# Patient Record
Sex: Male | Born: 1960 | Race: Black or African American | Hispanic: No | Marital: Single | Smoking: Current every day smoker
Health system: Southern US, Community
[De-identification: ages and names within clinical notes are randomized; demographics above are authoritative.]

## PROBLEM LIST (undated history)

## (undated) ENCOUNTER — Emergency Department (HOSPITAL_BASED_OUTPATIENT_CLINIC_OR_DEPARTMENT_OTHER): Admission: EM | Source: Ambulatory Visit

## (undated) DIAGNOSIS — E785 Hyperlipidemia, unspecified: Secondary | ICD-10-CM

## (undated) DIAGNOSIS — Z923 Personal history of irradiation: Secondary | ICD-10-CM

## (undated) DIAGNOSIS — I10 Essential (primary) hypertension: Secondary | ICD-10-CM

## (undated) DIAGNOSIS — K219 Gastro-esophageal reflux disease without esophagitis: Secondary | ICD-10-CM

## (undated) DIAGNOSIS — K254 Chronic or unspecified gastric ulcer with hemorrhage: Secondary | ICD-10-CM

## (undated) DIAGNOSIS — Z5189 Encounter for other specified aftercare: Secondary | ICD-10-CM

---

## 1987-06-19 HISTORY — PX: WRIST SURGERY: SHX841

## 1999-06-19 DIAGNOSIS — Z5189 Encounter for other specified aftercare: Secondary | ICD-10-CM

## 1999-06-19 HISTORY — DX: Encounter for other specified aftercare: Z51.89

## 2016-12-20 ENCOUNTER — Encounter (HOSPITAL_COMMUNITY): Payer: Self-pay | Admitting: Emergency Medicine

## 2016-12-20 ENCOUNTER — Emergency Department (HOSPITAL_COMMUNITY)
Admission: EM | Admit: 2016-12-20 | Discharge: 2016-12-20 | Disposition: A | Discharge status: Home / Self Care | Payer: Self-pay | Attending: Emergency Medicine | Admitting: Emergency Medicine

## 2016-12-20 DIAGNOSIS — L02212 Cutaneous abscess of back [any part, except buttock]: Secondary | ICD-10-CM | POA: Insufficient documentation

## 2016-12-20 DIAGNOSIS — F17200 Nicotine dependence, unspecified, uncomplicated: Secondary | ICD-10-CM | POA: Insufficient documentation

## 2016-12-20 DIAGNOSIS — L0291 Cutaneous abscess, unspecified: Secondary | ICD-10-CM

## 2016-12-20 MED ORDER — LIDOCAINE-EPINEPHRINE (PF) 2 %-1:200000 IJ SOLN
20.0000 mL | Freq: Once | INTRAMUSCULAR | Status: AC
  Administered 2016-12-20: 20 mL via INTRADERMAL
  Filled 2016-12-20: qty 20

## 2016-12-20 MED ORDER — OXYCODONE-ACETAMINOPHEN 5-325 MG PO TABS
1.0000 | ORAL_TABLET | Freq: Once | ORAL | Status: AC
  Administered 2016-12-20: 1 via ORAL
  Filled 2016-12-20: qty 1

## 2016-12-20 NOTE — ED Notes (Signed)
Room set up for I&D.

## 2016-12-20 NOTE — ED Notes (Signed)
Red, raised area on mid back, onset 1 week ago.

## 2016-12-20 NOTE — ED Provider Notes (Signed)
[facility]-EMERGENCY DEPT Provider Note   CSN: [id number] Arrival date & time: 12/20/16  [id number]  By signing my name below, I, Rosana Fretana Waskiewicz, attest that this documentation has been prepared under the direction and in the presence of non-physician practitioner, Lashea Goda A., PA-C. Electronically Signed: Rosana Fretana Waskiewicz, ED Scribe. 12/20/16. 9:56 AM.  History   Chief Complaint Chief Complaint  Patient presents with  . Abscess   The history is provided by the patient. No language interpreter was used.   HPI Comments: Christian Howard is a 56 y.o. male who presents to the Emergency Department complaining of a moderate, gradually worsening area of pain and swelling to the lower back onset 10 days ago. Pt has a hx of similar symptoms. Pt states pain is exacerbated with palpation and direct pressure. No hx of chronic back pain or IV drug use. Denies fever, chills, drainage from the area. NKDA  History reviewed. No pertinent past medical history.  There are no active problems to display for this patient.   History reviewed. No pertinent surgical history.   Home Medications    Prior to Admission medications   Not on File    Family History No family history on file.  Social History Social History  Substance Use Topics  . Smoking status: Current Every Day Smoker  . Smokeless tobacco: Current User  . Alcohol use No     Allergies   Patient has no allergy information on record.   Review of Systems Review of Systems  Constitutional: Negative for activity change, chills and fever.  Respiratory: Negative for shortness of breath.   Cardiovascular: Negative for chest pain.  Gastrointestinal: Negative for abdominal pain.  Musculoskeletal: Negative for back pain.  Skin: Positive for wound. Negative for rash.     Physical Exam Updated Vital Signs BP 131/70 (BP Location: Left Arm)   Pulse 95   Temp (!) 97.3 F (36.3 C) (Oral)   Resp 16   Ht 5\' 8"  (1.727 m)   Wt 102.1 kg (225 lb)    SpO2 98%   BMI 34.21 kg/m   Physical Exam  Constitutional: He appears well-developed.  HENT:  Head: Normocephalic.  Eyes: Conjunctivae are normal.  Neck: Neck supple.  Cardiovascular: Normal rate, regular rhythm and normal heart sounds.  Exam reveals no gallop and no friction rub.   No murmur heard. Pulmonary/Chest: Effort normal. No respiratory distress. He has wheezes (scattered expiratory on the right). He has no rales.  Abdominal: Soft. He exhibits no distension.  Neurological: He is alert.  Skin: Skin is warm and dry.  3 by 3 cm warm and indurated abscess on the right lower back. No active drainage. No surrounding warmth, erythema, or swelling.  Psychiatric: His behavior is normal.  Nursing note and vitals reviewed.  ED Treatments / Results  DIAGNOSTIC STUDIES: Oxygen Saturation is 98% on RA, normal by my interpretation.   COORDINATION OF CARE: 9:52 AM-Discussed next steps with pt including an US and possible course of antibiotics. Pt verbalized understanding and is agreeable with the plan.   Labs (all labs ordered are listed, but only abnormal results are displayed) Labs Reviewed - No data to display  EKG  EKG Interpretation None       Radiology No results found.  Procedures .Marland Kitchen.Incision and Drainage Date/Time: 12/20/2016 10:40 AM Performed by: Lilian KapurMCDONALD, Jezebelle Ledwell A Authorized by: Frederik PearMCDONALD, Rmani Kellogg A   Consent:    Consent obtained:  Verbal   Consent given by:  Patient   Risks discussed:  Bleeding,  incomplete drainage and pain Location:    Type:  Abscess   Size:  3x3 cm   Location:  Trunk   Trunk location: right lower back. Pre-procedure details:    Skin preparation:  Betadine Anesthesia (see MAR for exact dosages):    Anesthesia method:  Local infiltration   Local anesthetic:  Lidocaine 2% WITH epi Procedure type:    Complexity:  Simple Procedure details:    Needle aspiration: no     Incision types:  Single straight   Incision depth:  Dermal   Scalpel  blade:  11   Wound management:  Probed and deloculated   Drainage:  Bloody and purulent   Drainage amount:  Moderate   Wound treatment:  Wound left open   Packing materials:  None Post-procedure details:    Patient tolerance of procedure:  Tolerated well, no immediate complications    (including critical care time)  Medications Ordered in ED Medications  lidocaine-EPINEPHrine (XYLOCAINE W/EPI) 2 %-1:200000 (PF) injection 20 mL (20 mLs Intradermal Given 12/20/16 0930)  oxyCODONE-acetaminophen (PERCOCET/ROXICET) 5-325 MG per tablet 1 tablet (1 tablet Oral Given 12/20/16 1041)     Initial Impression / Assessment and Plan / ED Course  I have reviewed the triage vital signs and the nursing notes.  Pertinent labs & imaging results that were available during my care of the patient were reviewed by me and considered in my medical decision making (see chart for details).  EMERGENCY DEPARTMENT US SOFT TISSUE INTERPRETATION "Study: Limited Soft Tissue Ultrasound"  INDICATIONS: Soft tissue infection Multiple views of the body part were obtained in real-time with a multi-frequency linear probe  PERFORMED BY: Myself IMAGES ARCHIVED?: Yes SIDE:Right  BODY PART:Lower back INTERPRETATION:  Abcess present       Patient with skin abscess to the right low back. Incision and drainage performed in the ED today.  Fluid pocket noted on bedside ultrasound. Abscess was not large enough to warrant packing or drain placement. Wound recheck in 2-3 days if not improving. Referral given to [facility] and wellness. Supportive care and return precautions discussed. The patient appears reasonably screened and/or stabilized for discharge and I doubt any other emergent medical condition requiring further screening, evaluation, or treatment in the ED prior to discharge.  Final Clinical Impressions(s) / ED Diagnoses   Final diagnoses:  Abscess    New Prescriptions There are no discharge medications for this  patient.  I personally performed the services described in this documentation, which was scribed in my presence. The recorded information has been reviewed and is accurate.     Barkley Boards, PA-C 12/20/16 Lilian Kapur, MD 12/22/16 [phone]

## 2016-12-20 NOTE — ED Triage Notes (Signed)
Pt. Stated, I have a boil on my mid to lower back for over a week.

## 2016-12-20 NOTE — Discharge Instructions (Signed)
Please keep the wound clean with warm soap and water or the cleaning solution that you were sent home with. Please clean the wound at least once a day and change the gauze dressing at least once a day. You may take 800 mg of ibuprofen with food every 8 hours as needed for pain and inflammation control.  It is normal for the wound to continue to drain over the next few days. I have provided with a referral [facility] and wellness if he needs to get established with primary care or for follow-up if symptoms persist. However, if you develop a fever, chills, or if the wound becomes red, hot to the touch, or more swollen in the next 2-3 days, please return to the emergency department for re-evaluation.

## 2017-01-21 ENCOUNTER — Ambulatory Visit: Payer: Self-pay | Admitting: Family Medicine

## 2017-12-04 ENCOUNTER — Ambulatory Visit: Payer: BLUE CROSS/BLUE SHIELD | Admitting: Nurse Practitioner

## 2018-06-08 ENCOUNTER — Encounter (HOSPITAL_COMMUNITY): Payer: Self-pay | Admitting: Oncology

## 2018-06-08 ENCOUNTER — Other Ambulatory Visit: Payer: Self-pay

## 2018-06-08 ENCOUNTER — Emergency Department (HOSPITAL_COMMUNITY)
Admission: EM | Admit: 2018-06-08 | Discharge: 2018-06-08 | Disposition: A | Discharge status: Home / Self Care | Payer: Self-pay | Attending: Emergency Medicine | Admitting: Emergency Medicine

## 2018-06-08 ENCOUNTER — Emergency Department (HOSPITAL_COMMUNITY): Payer: Self-pay

## 2018-06-08 DIAGNOSIS — M456 Ankylosing spondylitis lumbar region: Secondary | ICD-10-CM | POA: Insufficient documentation

## 2018-06-08 DIAGNOSIS — F17200 Nicotine dependence, unspecified, uncomplicated: Secondary | ICD-10-CM | POA: Insufficient documentation

## 2018-06-08 DIAGNOSIS — M47816 Spondylosis without myelopathy or radiculopathy, lumbar region: Secondary | ICD-10-CM

## 2018-06-08 HISTORY — DX: Encounter for other specified aftercare: Z51.89

## 2018-06-08 HISTORY — DX: Chronic or unspecified gastric ulcer with hemorrhage: K25.4

## 2018-06-08 LAB — URINALYSIS, ROUTINE W REFLEX MICROSCOPIC
BILIRUBIN URINE: NEGATIVE
Glucose, UA: NEGATIVE mg/dL
HGB URINE DIPSTICK: NEGATIVE
Ketones, ur: NEGATIVE mg/dL
Leukocytes, UA: NEGATIVE
NITRITE: NEGATIVE
PH: 5.5 (ref 5.0–8.0)
Protein, ur: NEGATIVE mg/dL
Specific Gravity, Urine: >1.03 — ABNORMAL HIGH (ref 1.005–1.030)

## 2018-06-08 MED ORDER — PREDNISONE 20 MG PO TABS
60.0000 mg | ORAL_TABLET | Freq: Once | ORAL | Status: AC
Start: 2018-06-08 — End: 2018-06-08
  Administered 2018-06-08: 60 mg via ORAL
  Filled 2018-06-08: qty 3

## 2018-06-08 MED ORDER — PREDNISONE 10 MG PO TABS: 40.0000 mg | ORAL_TABLET | Freq: Every day | ORAL | 0 refills | Status: AC

## 2018-06-08 MED ORDER — METHOCARBAMOL 500 MG PO TABS
500.0000 mg | ORAL_TABLET | Freq: Once | ORAL | Status: AC
Start: 2018-06-08 — End: 2018-06-08
  Administered 2018-06-08: 500 mg via ORAL
  Filled 2018-06-08: qty 1

## 2018-06-08 MED ORDER — ACETAMINOPHEN 500 MG PO TABS
1000.0000 mg | ORAL_TABLET | Freq: Once | ORAL | Status: AC
  Administered 2018-06-08: 1000 mg via ORAL
  Filled 2018-06-08: qty 2

## 2018-06-08 MED ORDER — CYCLOBENZAPRINE HCL 10 MG PO TABS: 10.0000 mg | ORAL_TABLET | Freq: Three times a day (TID) | ORAL | 0 refills | Status: AC

## 2018-06-08 MED ORDER — IBUPROFEN 400 MG PO TABS
600.0000 mg | ORAL_TABLET | Freq: Once | ORAL | Status: AC
  Administered 2018-06-08: 600 mg via ORAL
  Filled 2018-06-08: qty 1

## 2018-06-08 NOTE — Discharge Instructions (Addendum)
You were seen in the ER for back pain.  X-ray shows spondylosis of your lumbar and thoracic spine.  This is narrowing of the disc spaces between your vertebra and arthritic changes to the bones in your back.  This is a common process and more frequently seen with aging.  I suspect your pain is from arthritis, possibly muscle spasms or an inflamed nerve (sciatica).  We will treat your pain with the following medication regimen: Prednisone 40 mg daily x 5 days Flexeril 10 mg every 8 hours x 3 days  For mild pain you can take ibuprofen 600 mg or acetaminophen [phone] mg every 6 hours. For more moderate pain, you can take both ibuprofen and acetaminophen every 6 hours.  Do not take ibuprofen if you have gastritis, ulcers, or known GI bleeding.  Heating pad as needed Over the counter lidocaine patches (salonpas) can be helpful  Avoid any exacerbating activities for the next 48 hours.  After 48 hours, start doing light back range of motion exercises and walking to avoid worsening back stiffness.   Return for fevers, chills, abdominal pain, changes in bowel movement, urinary symptoms, groin numbness, loss of bladder or bowel control, numbness weakness or heaviness to your extremities, rash.

## 2018-06-08 NOTE — ED Notes (Signed)
Patient transported to X-ray 

## 2018-06-08 NOTE — ED Triage Notes (Signed)
Pt c/o left lower back pain that radiates down buttocks.  Pt works for [employer] states he consistently does heavy lifting.  The pain Saturday increased to the point pt had to leave work to present here. Pt rates pain 8/10.

## 2018-06-08 NOTE — ED Provider Notes (Signed)
[facility] EMERGENCY [facility] Provider Note   CSN: [id number] Arrival date & time: 06/08/18  0554     History   Chief Complaint Chief Complaint  Patient presents with  . Back Pain    HPI Christian Howard is a 57 y.o. male is here for evaluation of back pain.  Onset 2 nights ago.  Initially the pain was mild so he went to work last night and had to leave because he got much worse while working.  The pain is located to the left low flank, low lumbar spine and radiates into the middle of his left buttock.  Described as sharp.  Moderate.  Intermittently worsens.  It is worse with certain movements and palpation.  He has to move slowly to prevent pain.  Sometimes it hurts when he coughs.  He applied topical cream which did not help.  It is slightly better when he sits still.  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  He has had no back injuries or surgeries.  No injections to the back recently.  He denies any fever, abdominal pain, hematuria, urinary frequency, dysuria, history of kidney stones, changes to his bowel movements.  No saddle anesthesia, loss of bladder or rectal tone, unilateral leg weakness.  HPI  Past Medical History:  Diagnosis Date  . Blood transfusion without reported diagnosis    states he had to have transfusion d/t gastric ulcer  . Gastric ulcer with hemorrhage     There are no active problems to display for this patient.   History reviewed. No pertinent surgical history.      Home Medications    Prior to Admission medications   Medication Sig Start Date End Date Taking? Authorizing Provider  cyclobenzaprine (FLEXERIL) 10 MG tablet Take 1 tablet (10 mg total) by mouth 3 (three) times daily for 3 days. 06/08/18 06/11/18  Liberty HandyGibbons, [place] Cookson J, PA-C  predniSONE (DELTASONE) 10 MG tablet Take 4 tablets (40 mg total) by mouth daily for 5 days. 06/08/18 06/13/18  Liberty HandyGibbons, Gracianna Vink J, PA-C    Family History No  family history on file.  Social History Social History   Tobacco Use  . Smoking status: Current Every Day Smoker  . Smokeless tobacco: Current User  Substance Use Topics  . Alcohol use: No  . Drug use: No     Allergies   Patient has no known allergies.   Review of Systems Review of Systems  Musculoskeletal: Positive for back pain.  All other systems reviewed and are negative.  Physical Exam Updated Vital Signs BP 131/64 (BP Location: Right Arm)   Pulse 77   Temp 99.9 F (37.7 C) (Oral)   Resp 17   Ht 5\' 8"  (1.727 m)   Wt 108.9 kg   SpO2 96%   BMI 36.49 kg/m   Physical Exam Constitutional:      General: He is not in acute distress.    Appearance: He is well-developed.  HENT:     Head: Normocephalic and atraumatic.     Nose: Nose normal.  Neck:     Comments: c-spine: no midline or paraspinal tenderness Cardiovascular:     Rate and Rhythm: Normal rate.     Pulses:          Radial pulses are 2+ on the right side and 2+ on the left side.       Dorsalis pedis pulses are 2+ on the right side and 2+ on the left  side.     Heart sounds: Normal heart sounds.  Pulmonary:     Effort: Pulmonary effort is normal.     Breath sounds: Normal breath sounds.  Abdominal:     Palpations: Abdomen is soft.     Tenderness: There is no abdominal tenderness.     Comments: Tenderness and CVAT to left low flank. No suprapubic tenderness.    Musculoskeletal:        General: Tenderness present.     Lumbar back: He exhibits tenderness and pain.     Comments: T-spine: no midline or paraspinal tenderness L-spine: no midline or paraspinal tenderness.  Mild left muscular tenderness.  Left SI joint and sciatic notch tender.  Positive left SLR.  Positive left Faber test.  Pelvis: no pain or crepitus with IR/ER/downward pressure of hips bilaterally. No AP/L instability noted with compression. No leg shortening or rotation.    Skin:    General: Skin is warm and dry.     Capillary Refill:  Capillary refill takes less than 2 seconds.     Comments: No overlaying rash to back   Neurological:     Mental Status: He is alert.     Sensory: No sensory deficit.     Comments: Can lift and hold legs without unilateral weakness or drift 5/5 strength with flexion/extension of hip, knee and ankle, bilaterally.  Sensation to light touch intact in lower extremities including feet  Psychiatric:        Behavior: Behavior normal.        Thought Content: Thought content normal.      ED Treatments / Results  Labs (all labs ordered are listed, but only abnormal results are displayed) Labs Reviewed  URINALYSIS, ROUTINE W REFLEX MICROSCOPIC - Abnormal; Notable for the following components:      Result Value   Specific Gravity, Urine >1.030 (*)    All other components within normal limits    EKG None  Radiology Dg Thoracic Spine 2 View  Result Date: 06/08/2018 CLINICAL DATA:  Left mid to lower back pain radiating to buttocks. EXAM: THORACIC SPINE 2 VIEWS COMPARISON:  None. FINDINGS: Vertebral body alignment and heights are normal. Disc spaces are within normal. There is minimal spondylosis of the thoracic spine. Pedicles are intact. No compression fracture or subluxation. Mild degenerate change of the visualized lower cervical spine. IMPRESSION: No acute findings. Mild spondylosis of the thoracic spine. Electronically Signed   By: Elberta Fortis M.D.   On: 06/08/2018 07:25   Dg Lumbar Spine Complete  Result Date: 06/08/2018 CLINICAL DATA:  Left low back pain radiating to buttocks. EXAM: LUMBAR SPINE - COMPLETE 4+ VIEW COMPARISON:  None. FINDINGS: Vertebral body alignment and heights are normal. There is mild spondylosis of the lumbar spine to include facet arthropathy over the lower lumbar spine. Disc space heights are maintained. No compression fracture or spondylolisthesis. Mild calcified plaque over the distal abdominal aorta and iliac arteries. IMPRESSION: No acute findings. Mild  spondylosis of the lumbar spine. Electronically Signed   By: Elberta Fortis M.D.   On: 06/08/2018 07:26    Procedures Procedures (including critical care time)  Medications Ordered in ED Medications  predniSONE (DELTASONE) tablet 60 mg (60 mg Oral Given 06/08/18 0622)  acetaminophen (TYLENOL) tablet 1,000 mg (1,000 mg Oral Given 06/08/18 0623)  ibuprofen (ADVIL,MOTRIN) tablet 600 mg (600 mg Oral Given 06/08/18 0623)  methocarbamol (ROBAXIN) tablet 500 mg (500 mg Oral Given 06/08/18 9629)     Initial Impression / Assessment and  Plan / ED Course  I have reviewed the triage vital signs and the nursing notes.  Pertinent labs & imaging results that were available during my care of the patient were reviewed by me and considered in my medical decision making (see chart for details).     57 yo with new onset back pain.  Atraumatic.  Exam is more consistent with MSk etiology, reproducible with palpation and SLR.  He did have mild low CVAT, however given clinical presentation renal stone/pyelonephritis considered less likely.  UA without RBCs or infection. X-rays confirm arthritis changes.  Doubt dissection.  Abdominal exam benign, without pulsatility, suprapubic or CVA tenderness. Distal pulses symmetric bilaterally. No focal neurological deficits. No overlaying rash. Considered UTI/pyelo, kidney stone, cauda equina, epidural abscess, dissection considered but these don't fit clinical picture. Favoring strain vs spasm vs arthritis vs radicular inflammation. No red flag features of back pain present such as saddle anesthesia, bladder/bowel incontinence or retention, fevers, h/o cancer, IVDU, preceding trauma or falls, unilateral weakness, urinary symptoms. Conservative measures such as ice/heat, mild stretches, prednisone, muscle relaxer and high dose NSAIDs indicated with PCP follow-up if no improvement with conservative management. ED return precautions discussed with patient who verbalized  understanding and is agreeable to plan.   Final Clinical Impressions(s) / ED Diagnoses   Final diagnoses:  Spondylosis of lumbar spine  Arthritis of lumbar spine    ED Discharge Orders         Ordered    predniSONE (DELTASONE) 10 MG tablet  Daily     06/08/18 0745    cyclobenzaprine (FLEXERIL) 10 MG tablet  3 times daily     06/08/18 0745           Liberty HandyGibbons, Shannah Conteh J, PA-C 06/08/18 [id number]    Shon BatonHorton, Courtney F, MD 06/08/18 2350

## 2018-06-13 ENCOUNTER — Emergency Department (HOSPITAL_COMMUNITY): Payer: Self-pay

## 2018-06-13 ENCOUNTER — Emergency Department (HOSPITAL_COMMUNITY)
Admission: EM | Admit: 2018-06-13 | Discharge: 2018-06-13 | Disposition: A | Discharge status: Home / Self Care | Payer: Self-pay | Attending: Emergency Medicine | Admitting: Emergency Medicine

## 2018-06-13 ENCOUNTER — Encounter (HOSPITAL_COMMUNITY): Payer: Self-pay | Admitting: *Deleted

## 2018-06-13 DIAGNOSIS — R1013 Epigastric pain: Secondary | ICD-10-CM | POA: Insufficient documentation

## 2018-06-13 DIAGNOSIS — K279 Peptic ulcer, site unspecified, unspecified as acute or chronic, without hemorrhage or perforation: Secondary | ICD-10-CM | POA: Insufficient documentation

## 2018-06-13 DIAGNOSIS — R911 Solitary pulmonary nodule: Secondary | ICD-10-CM | POA: Insufficient documentation

## 2018-06-13 DIAGNOSIS — F17200 Nicotine dependence, unspecified, uncomplicated: Secondary | ICD-10-CM | POA: Insufficient documentation

## 2018-06-13 LAB — CBC WITH DIFFERENTIAL/PLATELET
Abs Immature Granulocytes: 0 10*3/uL (ref 0.00–0.07)
Basophils Absolute: 0 10*3/uL (ref 0.0–0.1)
Basophils Relative: 0 %
EOS PCT: 0 %
Eosinophils Absolute: 0 10*3/uL (ref 0.0–0.5)
HCT: 47 % (ref 39.0–52.0)
HEMOGLOBIN: 15.6 g/dL (ref 13.0–17.0)
LYMPHS PCT: 20 %
Lymphs Abs: 1.5 10*3/uL (ref 0.7–4.0)
MCH: 28.8 pg (ref 26.0–34.0)
MCHC: 33.2 g/dL (ref 30.0–36.0)
MCV: 86.9 fL (ref 80.0–100.0)
MONO ABS: 0.4 10*3/uL (ref 0.1–1.0)
Monocytes Relative: 5 %
Neutro Abs: 5.6 10*3/uL (ref 1.7–7.7)
Neutrophils Relative %: 75 %
PLATELETS: 175 10*3/uL (ref 150–400)
RBC: 5.41 MIL/uL (ref 4.22–5.81)
RDW: 12.2 % (ref 11.5–15.5)
WBC: 7.5 10*3/uL (ref 4.0–10.5)
nRBC: 0 % (ref 0.0–0.2)
nRBC: 0 /100 WBC

## 2018-06-13 LAB — URINALYSIS, ROUTINE W REFLEX MICROSCOPIC
Bilirubin Urine: NEGATIVE
Glucose, UA: NEGATIVE mg/dL
Hgb urine dipstick: NEGATIVE
KETONES UR: 20 mg/dL — AB
Leukocytes, UA: NEGATIVE
Nitrite: NEGATIVE
Protein, ur: 30 mg/dL — AB
Specific Gravity, Urine: 1.027 (ref 1.005–1.030)
pH: 6 (ref 5.0–8.0)

## 2018-06-13 LAB — POC OCCULT BLOOD, ED: Fecal Occult Bld: POSITIVE — AB

## 2018-06-13 LAB — COMPREHENSIVE METABOLIC PANEL
ALK PHOS: 53 U/L (ref 38–126)
ALT: 20 U/L (ref 0–44)
AST: 28 U/L (ref 15–41)
Albumin: 3.1 g/dL — ABNORMAL LOW (ref 3.5–5.0)
Anion gap: 9 (ref 5–15)
BUN: 10 mg/dL (ref 6–20)
CALCIUM: 8.3 mg/dL — AB (ref 8.9–10.3)
CO2: 23 mmol/L (ref 22–32)
CREATININE: 0.95 mg/dL (ref 0.61–1.24)
Chloride: 102 mmol/L (ref 98–111)
GFR calc non Af Amer: >60 mL/min (ref >60)
Glucose, Bld: 109 mg/dL — ABNORMAL HIGH (ref 70–99)
Potassium: 3.8 mmol/L (ref 3.5–5.1)
Sodium: 134 mmol/L — ABNORMAL LOW (ref 135–145)
Total Bilirubin: 1 mg/dL (ref 0.3–1.2)
Total Protein: 6.3 g/dL — ABNORMAL LOW (ref 6.5–8.1)

## 2018-06-13 LAB — I-STAT TROPONIN, ED: Troponin i, poc: 0 ng/mL (ref 0.00–0.08)

## 2018-06-13 LAB — TYPE AND SCREEN
ABO/RH(D): A POS
Antibody Screen: NEGATIVE

## 2018-06-13 LAB — LIPASE, BLOOD: LIPASE: 32 U/L (ref 11–51)

## 2018-06-13 LAB — ABO/RH: ABO/RH(D): A POS

## 2018-06-13 MED ORDER — PANTOPRAZOLE SODIUM 20 MG PO TBEC: 40.0000 mg | DELAYED_RELEASE_TABLET | Freq: Every day | ORAL | 0 refills | Status: DC

## 2018-06-13 MED ORDER — ONDANSETRON HCL 4 MG/2ML IJ SOLN
4.0000 mg | Freq: Once | INTRAMUSCULAR | Status: AC
  Administered 2018-06-13: 4 mg via INTRAVENOUS
  Filled 2018-06-13: qty 2

## 2018-06-13 MED ORDER — DICYCLOMINE HCL 10 MG/ML IM SOLN
20.0000 mg | Freq: Once | INTRAMUSCULAR | Status: AC
  Administered 2018-06-13: 20 mg via INTRAMUSCULAR
  Filled 2018-06-13: qty 2

## 2018-06-13 MED ORDER — ONDANSETRON 4 MG PO TBDP: 4.0000 mg | ORAL_TABLET | Freq: Three times a day (TID) | ORAL | 0 refills | Status: DC | PRN

## 2018-06-13 MED ORDER — PANTOPRAZOLE SODIUM 40 MG IV SOLR
40.0000 mg | Freq: Once | INTRAVENOUS | Status: AC
  Administered 2018-06-13: 40 mg via INTRAVENOUS
  Filled 2018-06-13: qty 40

## 2018-06-13 MED ORDER — SODIUM CHLORIDE 0.9 % IV BOLUS
1000.0000 mL | Freq: Once | INTRAVENOUS | Status: AC
  Administered 2018-06-13: 1000 mL via INTRAVENOUS

## 2018-06-13 MED ORDER — LIDOCAINE VISCOUS HCL 2 % MT SOLN
15.0000 mL | Freq: Once | OROMUCOSAL | Status: AC
Start: 2018-06-13 — End: 2018-06-13
  Administered 2018-06-13: 15 mL via ORAL
  Filled 2018-06-13: qty 15

## 2018-06-13 MED ORDER — ALUM & MAG HYDROXIDE-SIMETH 200-200-20 MG/5ML PO SUSP
30.0000 mL | Freq: Once | ORAL | Status: AC
  Administered 2018-06-13: 30 mL via ORAL
  Filled 2018-06-13: qty 30

## 2018-06-13 NOTE — Discharge Planning (Signed)
Tashianna Broome J. Lucretia RoersWood, RN, BSN, [place] [phone]  [facility] Messingoger Gomez[facility] 1/22 @10 :00.  Spoke with pt at bedside and advised to please arrive 15 min early and take a picture ID and your current medications.  Pt verbalizes understanding of keeping appointment.

## 2018-06-13 NOTE — ED Provider Notes (Signed)
[facility] Provider Note   CSN: [id number] Arrival date & time: 06/13/18  [id number]     History   Chief Complaint Chief Complaint  Patient presents with  . Abdominal Pain    HPI Christian Howard is a 57 y.o. male.  HPI  Reports abdominal pain for the last five days Hx of ulcers Reports nausea, dizzy spells Today when went to the bathroom had dark stool Feels like a "ball of fire", epigastric pain, burning pain for 5 days Feels like ulcers has had before Has not been able to eat. Low appetite. Not necessarily worse with eating Feels like wants to throw up but doesn't have anything to throw up No diarrhea or constipation, Had tarry stool yesterday x1 No fevers. Had some chills, at first thought it was flu, tried theraflu Has not been taking ibuprofen or drinking etoh  Was just here for back pain, started on prednisone and flexeril  Had previously been seen in [place] for ulcers, does not have local GI  Past Medical History:  Diagnosis Date  . Blood transfusion without reported diagnosis    states he had to have transfusion d/t gastric ulcer  . Gastric ulcer with hemorrhage     There are no active problems to display for this patient.   History reviewed. No pertinent surgical history.      Home Medications    Prior to Admission medications   Medication Sig Start Date End Date Taking? Authorizing Provider  cyclobenzaprine (FLEXERIL) 10 MG tablet Take 10 mg by mouth 3 (three) times daily as needed for muscle spasms.   Yes [provider]  ibuprofen (ADVIL,MOTRIN) 200 MG tablet Take 400 mg by mouth every 6 (six) hours as needed.   Yes [provider]  ondansetron (ZOFRAN ODT) 4 MG disintegrating tablet Take 1 tablet (4 mg total) by mouth every 8 (eight) hours as needed for nausea or vomiting. 06/13/18   Alvira MondaySchlossman, Corwin Kuiken, MD  pantoprazole (PROTONIX) 20 MG tablet Take 2 tablets (40 mg total) by mouth daily for 21 days.  06/13/18 07/04/18  Alvira MondaySchlossman, Trenisha Lafavor, MD  predniSONE (DELTASONE) 10 MG tablet Take 4 tablets (40 mg total) by mouth daily for 5 days. Patient not taking: Reported on 06/13/2018 06/08/18 06/13/18  Liberty HandyGibbons, Claudia J, PA-C    Family History History reviewed. No pertinent family history.  Social History Social History   Tobacco Use  . Smoking status: Current Every Day Smoker  . Smokeless tobacco: Current User  Substance Use Topics  . Alcohol use: No  . Drug use: No     Allergies   Patient has no known allergies.   Review of Systems Review of Systems  Constitutional: Positive for appetite change and fatigue. Negative for fever.  HENT: Negative for sore throat.   Eyes: Negative for visual disturbance.  Respiratory: Positive for cough (prod green mucus). Negative for shortness of breath.   Cardiovascular: Negative for chest pain.  Gastrointestinal: Positive for abdominal pain, blood in stool, nausea and vomiting.  Genitourinary: Negative for difficulty urinating and dysuria.  Musculoskeletal: Negative for back pain and neck stiffness.  Skin: Negative for rash.  Neurological: Positive for light-headedness. Negative for syncope and headaches.     Physical Exam Updated Vital Signs BP 139/78 (BP Location: Right Arm)   Pulse 91   Temp 98.2 F (36.8 C) (Oral)   Resp 16   SpO2 96%   Physical Exam Vitals signs and nursing note reviewed.  Constitutional:  General: He is not in acute distress.    Appearance: He is well-developed. He is not diaphoretic.  HENT:     Head: Normocephalic and atraumatic.  Eyes:     Conjunctiva/sclera: Conjunctivae normal.  Neck:     Musculoskeletal: Normal range of motion.  Cardiovascular:     Rate and Rhythm: Normal rate and regular rhythm.     Heart sounds: Normal heart sounds. No murmur. No friction rub. No gallop.   Pulmonary:     Effort: Pulmonary effort is normal. No respiratory distress.     Breath sounds: Normal breath sounds. No  wheezing or rales.  Abdominal:     General: There is no distension.     Palpations: Abdomen is soft.     Tenderness: There is abdominal tenderness in the epigastric area. There is no guarding. Negative signs include Murphy's sign and McBurney's sign (initially reported tenderness but as continued to assess denied any pain).  Skin:    General: Skin is warm and dry.  Neurological:     Mental Status: He is alert and oriented to person, place, and time.      ED Treatments / Results  Labs (all labs ordered are listed, but only abnormal results are displayed) Labs Reviewed  COMPREHENSIVE METABOLIC PANEL - Abnormal; Notable for the following components:      Result Value   Sodium 134 (*)    Glucose, Bld 109 (*)    Calcium 8.3 (*)    Total Protein 6.3 (*)    Albumin 3.1 (*)    All other components within normal limits  URINALYSIS, ROUTINE W REFLEX MICROSCOPIC - Abnormal; Notable for the following components:   Ketones, ur 20 (*)    Protein, ur 30 (*)    Bacteria, UA RARE (*)    All other components within normal limits  POC OCCULT BLOOD, ED - Abnormal; Notable for the following components:   Fecal Occult Bld POSITIVE (*)    All other components within normal limits  CBC WITH DIFFERENTIAL/PLATELET  LIPASE, BLOOD  I-STAT TROPONIN, ED  TYPE AND SCREEN    EKG EKG Interpretation  Date/Time:  Friday June 13 2018 09:47:31 EST Ventricular Rate:  84 PR Interval:    QRS Duration: 75 QT Interval:  359 QTC Calculation: 425 R Axis:   69 Text Interpretation:  Sinus rhythm No previous ECGs available Confirmed by Alvira MondaySchlossman, Safiyah Cisney ([id number]) on 06/13/2018 10:20:47 AM   Radiology Dg Chest 2 View  Result Date: 06/13/2018 CLINICAL DATA:  Epigastric pain EXAM: CHEST - 2 VIEW COMPARISON:  None. FINDINGS: 13 mm nodular density at the right base. There is no edema, consolidation, effusion, or pneumothorax. Mild interstitial coarsening, likely bronchitic. Normal heart size and mediastinal  contours. IMPRESSION: 1. 13 mm nodular density on the right, recommend chest CT. 2. Generalized bronchitic markings. Electronically Signed   By: Marnee SpringJonathon  Watts M.D.   On: 06/13/2018 10:16    Procedures Procedures (including critical care time)  Medications Ordered in ED Medications  ondansetron (ZOFRAN) injection 4 mg (4 mg Intravenous Given 06/13/18 0848)  sodium chloride 0.9 % bolus 1,000 mL (0 mLs Intravenous Stopped 06/13/18 0949)  alum & mag hydroxide-simeth (MAALOX/MYLANTA) 200-200-20 MG/5ML suspension 30 mL (30 mLs Oral Given 06/13/18 0848)    And  lidocaine (XYLOCAINE) 2 % viscous mouth solution 15 mL (15 mLs Oral Given 06/13/18 0848)  dicyclomine (BENTYL) injection 20 mg (20 mg Intramuscular Given 06/13/18 0848)  pantoprazole (PROTONIX) injection 40 mg (40 mg Intravenous Given 06/13/18 0849)  Initial Impression / Assessment and Plan / ED Course  I have reviewed the triage vital signs and the nursing notes.  Pertinent labs & imaging results that were available during my care of the patient were reviewed by me and considered in my medical decision making (see chart for details).     57yo male with history of peptic ulcer disease requiring blood transfusion who presents with burning epigastric pain.  Differential diagnosis includes cholecystitis, pancreatitis, peptic ulcer disease, appendicitis, cardiac etiology.  Labs obtained show no evidence of pancreatitis, no transaminitis.  His abdominal exam is benign, without evidence of appendicitis or cholecystitis.  Given epigastric pain and hiccups, ordered a troponin which is negative, and EKG which shows no acute findings.  Chest x-ray done given history of cough which shows no evidence of pneumonia. UA without infection.  Overall, suspect patient's symptoms are secondary to gastritis or peptic ulcer disease.  He had described dark stool at home.  His Hemoccult is positive, however he does not have melena on exam.  His hemoglobin  is normal.  Do not suspect severe upper gastrointestinal bleed given normal vital signs, normal hemoglobin, normal color of stool on exam.    Recommend twice daily PPI, and follow-up with gastroenterology and PCP.  Has CXR showing nodular density, recommend close outpt CT follow up. Discussed reasons to return.   Final Clinical Impressions(s) / ED Diagnoses   Final diagnoses:  Epigastric abdominal pain  Peptic ulcer disease  Pulmonary nodule    ED Discharge Orders         Ordered    pantoprazole (PROTONIX) 20 MG tablet  Daily     06/13/18 1023    ondansetron (ZOFRAN ODT) 4 MG disintegrating tablet  Every 8 hours PRN     06/13/18 1023           Alvira Monday, MD 06/13/18 1024

## 2018-06-13 NOTE — ED Triage Notes (Addendum)
Pt in c/o abdominal pain for the last 5 days, reports nausea but denies vomiting, history of gastric ulcers, recently seen and admitted for same

## 2018-06-13 NOTE — ED Notes (Signed)
ED Provider at bedside. 

## 2018-06-13 NOTE — ED Notes (Signed)
Pt discharged from ED; instructions provided and scripts given; Pt encouraged to return to ED if symptoms worsen and to f/u with PCP; Pt verbalized understanding of all instructions 

## 2018-06-13 NOTE — ED Notes (Signed)
Case Management at bedside.

## 2018-07-09 ENCOUNTER — Inpatient Hospital Stay (INDEPENDENT_AMBULATORY_CARE_PROVIDER_SITE_OTHER): Payer: Self-pay | Admitting: Nurse Practitioner

## 2018-10-07 ENCOUNTER — Other Ambulatory Visit: Payer: Self-pay

## 2018-10-07 ENCOUNTER — Ambulatory Visit (HOSPITAL_COMMUNITY)
Admission: EM | Admit: 2018-10-07 | Discharge: 2018-10-07 | Disposition: A | Discharge status: Home / Self Care | Payer: Self-pay | Attending: Family Medicine | Admitting: Family Medicine

## 2018-10-07 ENCOUNTER — Encounter (HOSPITAL_COMMUNITY): Payer: Self-pay

## 2018-10-07 DIAGNOSIS — R21 Rash and other nonspecific skin eruption: Secondary | ICD-10-CM

## 2018-10-07 MED ORDER — PREDNISONE 10 MG (21) PO TBPK: ORAL_TABLET | Freq: Every day | ORAL | 0 refills | Status: DC

## 2018-10-07 NOTE — ED Triage Notes (Signed)
Pt presents with recurrent rash on back of neck, ears and going down his arm from unknown source for about 30 days.

## 2018-10-07 NOTE — ED Provider Notes (Signed)
[facility]   [id number] 10/07/18 Arrival Time: 1542  ASSESSMENT & PLAN:  1. Rash and nonspecific skin eruption    Difficult to say what exact etiology is. Discussed. May benefit from dermatology evaluation.  Trial of: Meds ordered this encounter  Medications  . predniSONE (STERAPRED UNI-PAK 21 TAB) 10 MG (21) TBPK tablet    Sig: Take by mouth daily. Take as directed.    Dispense:  21 tablet    Refill:  0   Follow-up Information    Schedule an appointment as soon as possible for a visit  with [facility].   Specialty:  Dermatology Contact information: [address] [place] [address] [place] [address] [phone]        Schedule an appointment as soon as possible for a visit  with Dermatology, [facility].   Contact information: [address] [place] [address] [phone]           Reviewed expectations re: course of current medical issues. Questions answered. Outlined signs and symptoms indicating need for more acute intervention. Patient verbalized understanding. After Visit Summary given.   SUBJECTIVE:  Christian Howard is a 58 y.o. male who presents with a skin complaint.   Location: neckline and inner forearms bilaterally Onset: abrupt Duration: at least one month Associated pruritis? Mild at times Associated pain? none Progression: fluctuating a bit  Drainage? No  Known trigger? No  New soaps/lotions/topicals/detergents/environmental exposures? No Contacts with similar? No Recent travel? No  Other associated symptoms: none Therapies tried thus far: OTC "cream" with mild help Arthralgia or myalgia? none Recent illness? none Fever? none No specific aggravating or alleviating factors reported. No new medications within the past month.  ROS: As per HPI.  OBJECTIVE: Vitals:   10/07/18 1612  BP: 135/86  Pulse: 96  Resp: 18  TempSrc: Oral  SpO2: 99%    General appearance: alert; no distress Lungs: clear to  auscultation bilaterally Heart: regular rate and rhythm Extremities: no edema Skin: warm and dry; signs of infection: no; darkening and slight thickening of skin over back of neck and ears; mild similar changes on upper extremities (mostly distally); no raised areas; skin is non-tender; no scaling or erythema Psychological: alert and cooperative; normal mood and affect  No Known Allergies  Past Medical History:  Diagnosis Date  . Blood transfusion without reported diagnosis    states he had to have transfusion d/t gastric ulcer  . Gastric ulcer with hemorrhage    Social History   Socioeconomic History  . Marital status: Single    Spouse name: Not on file  . Number of children: Not on file  . Years of education: Not on file  . Highest education level: Not on file  Occupational History  . Not on file  Social Needs  . Financial resource strain: Not on file  . Food insecurity:    Worry: Not on file    Inability: Not on file  . Transportation needs:    Medical: Not on file    Non-medical: Not on file  Tobacco Use  . Smoking status: Current Every Day Smoker  . Smokeless tobacco: Current User  Substance and Sexual Activity  . Alcohol use: No  . Drug use: No  . Sexual activity: Yes  Lifestyle  . Physical activity:    Days per week: Not on file    Minutes per session: Not on file  . Stress: Not on file  Relationships  . Social connections:    Talks on  phone: Not on file    Gets together: Not on file    Attends religious service: Not on file    Active member of club or organization: Not on file    Attends meetings of clubs or organizations: Not on file    Relationship status: Not on file  . Intimate partner violence:    Fear of current or ex partner: Not on file    Emotionally abused: Not on file    Physically abused: Not on file    Forced sexual activity: Not on file  Other Topics Concern  . Not on file  Social History Narrative  . Not on file    History  reviewed. No pertinent surgical history.   Mardella LaymanHagler, Wrenly Lauritsen, MD 10/15/18 [phone]

## 2018-11-02 ENCOUNTER — Other Ambulatory Visit: Payer: Self-pay

## 2018-11-02 ENCOUNTER — Encounter (HOSPITAL_COMMUNITY): Payer: Self-pay | Admitting: *Deleted

## 2018-11-02 ENCOUNTER — Emergency Department (HOSPITAL_COMMUNITY)
Admission: EM | Admit: 2018-11-02 | Discharge: 2018-11-02 | Disposition: A | Discharge status: Home / Self Care | Payer: Managed Care, Other (non HMO) | Attending: Emergency Medicine | Admitting: Emergency Medicine

## 2018-11-02 DIAGNOSIS — Z79899 Other long term (current) drug therapy: Secondary | ICD-10-CM | POA: Diagnosis not present

## 2018-11-02 DIAGNOSIS — F17200 Nicotine dependence, unspecified, uncomplicated: Secondary | ICD-10-CM | POA: Insufficient documentation

## 2018-11-02 DIAGNOSIS — R21 Rash and other nonspecific skin eruption: Secondary | ICD-10-CM | POA: Diagnosis present

## 2018-11-02 MED ORDER — AQUAPHOR EX OINT: TOPICAL_OINTMENT | Freq: Two times a day (BID) | CUTANEOUS | 0 refills | Status: DC | PRN

## 2018-11-02 MED ORDER — HYDROXYZINE HCL 25 MG PO TABS: 25.0000 mg | ORAL_TABLET | Freq: Three times a day (TID) | ORAL | 0 refills | Status: DC | PRN

## 2018-11-02 MED ORDER — CEPHALEXIN 500 MG PO CAPS: 500.0000 mg | ORAL_CAPSULE | Freq: Two times a day (BID) | ORAL | 0 refills | Status: AC

## 2018-11-02 NOTE — ED Notes (Signed)
Patient verbalizes understanding of discharge instructions . Opportunity for questions and answers were provided . Armband removed by staff ,Pt discharged from ED. W/C  offered at D/C  and Declined W/C at D/C and was escorted to lobby by RN.  

## 2018-11-02 NOTE — Discharge Instructions (Addendum)
Evaluated today for possible rash.  This is possibly related to dermatitis, however I will prescribe antibiotics for possible secondary infection.  I have also given a prescription for a cream to place on these areas.  I do feel that you need to follow-up with dermatology for evaluation.

## 2018-11-02 NOTE — ED Provider Notes (Signed)
[facility] Provider Note   CSN: [id number] Arrival date & time: 11/02/18  [id number]  History   Chief Complaint Chief Complaint  Patient presents with   Rash    HPI Christian NorthSteven Howard is a 58 y.o. male with past medical history significant for gastric ulcer who presents for evaluation of rash. Rash present for 2 months. Has been seen by UC in March and prescribed Prednisone. Rash has continued despite use of prednisone.  Rash is pruritic in nature.  Rash mainly located to patient's face.  Neck as well as flexural creases, however has been spreading down the anterior surface of his bilateral forearms.  He has not noted any bleeding or drainage from lesions.  Denies any open sores.  Denies new soaps, lotions, perfumes.  No contact with poison ivy.  He denies history of diabetes, fever, chills, nausea, vomiting, headache, neck pain, neck stiffness, shortness of breath, cough, abdominal pain, diarrhea dysuria.  He has had no recent travel or known exposures to people with similar symptoms.  Denies tick bites. No GU or urinary symptoms.   History obtained from patient. No interpretor was used.     HPI  Past Medical History:  Diagnosis Date   Blood transfusion without reported diagnosis    states he had to have transfusion d/t gastric ulcer   Gastric ulcer with hemorrhage     There are no active problems to display for this patient.   History reviewed. No pertinent surgical history.      Home Medications    Prior to Admission medications   Medication Sig Start Date End Date Taking? Authorizing Provider  cephALEXin (KEFLEX) 500 MG capsule Take 1 capsule (500 mg total) by mouth 2 (two) times daily for 7 days. 11/02/18 11/09/18  Kao Berkheimer A, PA-C  cyclobenzaprine (FLEXERIL) 10 MG tablet Take 10 mg by mouth 3 (three) times daily as needed for muscle spasms.    [provider]  hydrOXYzine (ATARAX/VISTARIL) 25 MG tablet Take 1 tablet (25  mg total) by mouth every 8 (eight) hours as needed for itching. 11/02/18   Allora Bains A, PA-C  ibuprofen (ADVIL,MOTRIN) 200 MG tablet Take 400 mg by mouth every 6 (six) hours as needed.    [provider]  mineral oil-hydrophilic petrolatum (AQUAPHOR) ointment Apply topically 2 (two) times daily as needed for dry skin. 11/02/18   Paiden Cavell A, PA-C  ondansetron (ZOFRAN ODT) 4 MG disintegrating tablet Take 1 tablet (4 mg total) by mouth every 8 (eight) hours as needed for nausea or vomiting. 06/13/18   Alvira MondaySchlossman, Erin, MD  pantoprazole (PROTONIX) 20 MG tablet Take 2 tablets (40 mg total) by mouth daily for 21 days. 06/13/18 07/04/18  Alvira MondaySchlossman, Erin, MD  predniSONE (STERAPRED UNI-PAK 21 TAB) 10 MG (21) TBPK tablet Take by mouth daily. Take as directed. 10/07/18   Mardella LaymanHagler, Brian, MD    Family History History reviewed. No pertinent family history.  Social History Social History   Tobacco Use   Smoking status: Current Every Day Smoker   Smokeless tobacco: Current User  Substance Use Topics   Alcohol use: No   Drug use: No     Allergies   Patient has no known allergies.   Review of Systems Review of Systems  Constitutional: Negative.   HENT: Negative.   Eyes: Negative.   Respiratory: Negative.   Cardiovascular: Negative.   Gastrointestinal: Negative.   Endocrine: Negative.   Genitourinary: Negative.   Skin: Positive for rash. Negative for  color change, pallor and wound.  Neurological: Negative.   All other systems reviewed and are negative.    Physical Exam Updated Vital Signs BP (!) 170/88 (BP Location: Right Arm)    Pulse 93    Temp 97.7 F (36.5 C) (Oral)    Resp 16    Ht 5\' 8"  (1.727 m)    Wt 99.8 kg    SpO2 100%    BMI 33.45 kg/m   Physical Exam Vitals signs and nursing note reviewed.  Constitutional:      General: He is not in acute distress.    Appearance: He is well-developed. He is not ill-appearing, toxic-appearing or diaphoretic.      Comments: Talking on phone on initial evaluation. No acute distress noted.  HENT:     Head: Normocephalic and atraumatic.     Jaw: There is normal jaw occlusion.     Comments: No contusions, abrasions, rashes, vesicles or lesions.    Right Ear: Tympanic membrane, ear canal and external ear normal. No drainage, swelling or tenderness. There is no impacted cerumen. Tympanic membrane is not injected, scarred, perforated, erythematous, retracted or bulging.     Left Ear: Tympanic membrane, ear canal and external ear normal. No drainage, swelling or tenderness. There is no impacted cerumen. Tympanic membrane is not injected, scarred, perforated, erythematous, retracted or bulging.     Nose: Nose normal.     Right Sinus: No maxillary sinus tenderness or frontal sinus tenderness.     Left Sinus: No maxillary sinus tenderness or frontal sinus tenderness.     Mouth/Throat:     Lips: Pink.     Mouth: Mucous membranes are moist. No injury, lacerations, oral lesions or angioedema.     Dentition: Normal dentition.     Tongue: No lesions. Tongue does not deviate from midline.     Pharynx: Oropharynx is clear. Uvula midline.     Tonsils: No tonsillar exudate or tonsillar abscesses.     Comments: Posterior oropharynx clear.  Mucous membranes moist.  No oral lesions, vesicles, lacerations.  Tonsils without erythema or exudate.  Uvula midline without deviation.  Sublingual area soft. Eyes:     General: Lids are normal. No allergic shiner, visual field deficit or scleral icterus.    Extraocular Movements: Extraocular movements intact.     Conjunctiva/sclera: Conjunctivae normal.     Pupils: Pupils are equal, round, and reactive to light.  Neck:     Musculoskeletal: Full passive range of motion without pain, normal range of motion and neck supple.     Vascular: No JVD.     Trachea: Trachea and phonation normal.      Comments: Neck stiffness or neck rigidity.  No JVD.  Phonation normal.  No meningeal  signs. Cardiovascular:     Rate and Rhythm: Normal rate and regular rhythm.     Pulses: Normal pulses.     Heart sounds: Normal heart sounds.  Pulmonary:     Effort: Pulmonary effort is normal. No respiratory distress.     Breath sounds: Normal breath sounds and air entry.     Comments: Clear to ascultation without wheeze, rhonchi or rales.  Speaks in full sentences without difficulty.  No accessory muscle usage.  No tachypnea. Chest:     Comments: Chest wall tenderness palpation.  No crepitus, edema or deformity.  No rashes or lesions. Abdominal:     General: There is no distension.     Palpations: Abdomen is soft.     Comments: Soft,  Nontender without rebound or guarding.  No abdominal wall skin changes.  Musculoskeletal: Normal range of motion.     Comments: Moves all 4 extremities without difficulty.  No lower extremity edema, erythema, ecchymosis or warmth.  Feet:     Right foot:     Skin integrity: Skin integrity normal.     Left foot:     Skin integrity: Skin integrity normal.     Comments: 2+DP pulses bilaterally. Lymphadenopathy:     Cervical: No cervical adenopathy.  Skin:    General: Skin is warm and dry.     Comments: Maculopapular rash to posterior neck as well as in bilateral upper flexural creases..  Intermittent erythema without warmth.  No fluctuance or induration.  No vesicles or bulla.  No target lesions. Rash non tender to palpation. No abscess.  Neurological:     Mental Status: He is alert.     Comments: CN 2 through 12 grossly intact.  Ambulatory in Ed without difficulty. No dysphasia.  No facial droop.          ED Treatments / Results  Labs (all labs ordered are listed, but only abnormal results are displayed) Labs Reviewed - No data to display  EKG None  Radiology No results found.  Procedures Procedures (including critical care time)  Medications Ordered in ED Medications - No data to display   Initial Impression / Assessment and Plan  / ED Course  I have reviewed the triage vital signs and the nursing notes.  Pertinent labs & imaging results that were available during my care of the patient were reviewed by me and considered in my medical decision making (see chart for details).  58 year old male appears otherwise well presents for evaluation of rash.  Afebrile, nonseptic, non-ill-appearing.  Rash began 2 months ago and has been gradually spreading.  Patient has a maculopapular erythematous rash to his posterior neck.  He has also some dry scaling and some excoriations.  Also has similar rash to his bilateral elbows flexural creases with some mild spreading to the anterior surface of his bilateral proximal forearms.  He has no vesicles, lesions, bulla.  There is a slightly erythematous pattern to this.  He has no fluctuance or induration.  He has no systemic symptoms.  Heart and lungs clear. No facial rash, facial swelling or eye involvement. Normal musculoskeletal exam. He is neurovascularly intact.  He has no neck stiffness or neck rigidity, no meningismus. Patient denies any difficulty breathing or swallowing.  Pt has a patent airway without stridor and is handling secretions without difficulty; no angioedema. No blisters, no pustules, no warmth, no draining sinus tracts, no superficial abscesses, no bullous impetigo, no vesicles, no desquamation, no target lesions with dusky purpura or a central bulla. Not tender to touch. No concern for superimposed infection. No concern for SJS, TEN, TSS, tick borne illness, syphilis or other life-threatening condition.  Given erythema will cover with antibiotics for possible cellulitis, however given duration of symptoms this likely could be a combination of dermatitis.  Low suspicion for acute emergent pathology. Darkening skin under rash possible from a component of recurrent topical steroid use vs acanthosis nigricans? No dx of DM, polyuria, polydipsia, weight changes. Will has patient follow up  with BG check with PCP. Patient has tried oral steroids this has not resolved his symptoms will hold on additional steroids and DC home with topical emollient for pruritus/dryness as well as recommend Atarax as needed for itching.  I do feel given patient's had  symptoms x2 months that he should follow-up with dermatology for evaluation and possible biopsy.  Patient is hemodynamically stable and in no acute distress.  Patient able to ambulate in department prior to ED.  Evaluation does not show acute pathology that would require ongoing or additional emergent interventions while in the emergency department or further inpatient treatment.  I have discussed the diagnosis with the patient and answered all questions.  Patient has no further complaints prior to discharge.  Patient is comfortable with plan discussed in room and is stable for discharge at this time.  I have discussed strict return precautions for returning to the emergency department.  Patient was encouraged to follow-up with PCP/specialist referred to at discharge.     Final Clinical Impressions(s) / ED Diagnoses   Final diagnoses:  Rash    ED Discharge Orders         Ordered    cephALEXin (KEFLEX) 500 MG capsule  2 times daily     11/02/18 0822    mineral oil-hydrophilic petrolatum (AQUAPHOR) ointment  2 times daily PRN     11/02/18 0822    hydrOXYzine (ATARAX/VISTARIL) 25 MG tablet  Every 8 hours PRN     11/02/18 0822           Sheehan Stacey A, PA-C 11/02/18 [id number]    Tegeler, Canary Brim, MD 11/02/18 1230

## 2018-11-02 NOTE — ED Triage Notes (Signed)
PT reports rash started in March. Pt has been to urgent care and was treated on 10/07/2018. Pt completed steroid dose pack and still has rash with itching. Rash present on neck,arms today.

## 2018-11-09 ENCOUNTER — Encounter (HOSPITAL_COMMUNITY): Payer: Self-pay | Admitting: *Deleted

## 2018-11-09 ENCOUNTER — Emergency Department (HOSPITAL_COMMUNITY)
Admission: EM | Admit: 2018-11-09 | Discharge: 2018-11-09 | Disposition: A | Discharge status: Home / Self Care | Payer: Managed Care, Other (non HMO) | Attending: Emergency Medicine | Admitting: Emergency Medicine

## 2018-11-09 ENCOUNTER — Other Ambulatory Visit: Payer: Self-pay

## 2018-11-09 DIAGNOSIS — R21 Rash and other nonspecific skin eruption: Secondary | ICD-10-CM | POA: Insufficient documentation

## 2018-11-09 DIAGNOSIS — L2082 Flexural eczema: Secondary | ICD-10-CM

## 2018-11-09 NOTE — Discharge Instructions (Addendum)
You have been diagnosed today with Rash  At this time there does not appear to be the presence of an emergent medical condition, however there is always the potential for conditions to change. Please read and follow the below instructions.  Please return to the Emergency Department immediately for any new or worsening symptoms. Please be sure to follow up with your Primary Care Provider within one week regarding your visit today; please call their office to schedule an appointment even if you are feeling better for a follow-up visit. Please continue the Aquaphor cream as previously prescribed to help with your rash.  Please avoid unnecessary sun exposure.  Please avoid scratching your rash.  Please call your primary care doctor's office tomorrow to schedule a follow-up appointment.  I also recommend that you seek dermatologist evaluation as biopsies may be necessary for diagnosis, unfortunately there are no on-call dermatologist that you may refer you to however your primary care provider can refer you to a dermatologist please discuss this with him over the phone tomorrow.  Get help right away if: You have a fever and your symptoms suddenly get worse. You start to feel mixed up (confused). You have a very bad headache or a stiff neck. You have very bad joint pains or stiffness. You have jerky movements that you cannot control (seizure). Your rash covers all or most of your body. The rash may or may not be painful. You have blisters that: Are on top of the rash. Grow larger. Grow together. Are painful. Are inside your nose or mouth. You have a rash that: Looks like purple pinprick-sized spots all over your body. Has a "bull's eye" or looks like a target. Is red and painful, causes your skin to peel, and is not from being in the sun too long.  Please read the additional information packets attached to your discharge summary.

## 2018-11-09 NOTE — ED Notes (Signed)
Patient not in room. Left without discharge instructions.

## 2018-11-09 NOTE — ED Triage Notes (Signed)
Pt reports he has skin irritation and rash about head ,neck arms since March . Pt reports he has clear bumps on head and skin peels some times.

## 2018-11-09 NOTE — ED Provider Notes (Signed)
[facility] EMERGENCY DEPARTMENT Provider Note   CSN: [id number] Arrival date & time: 11/09/18  1208    History   Chief Complaint Chief Complaint  Patient presents with   Rash    HPI Christian Howard is a 58 y.o. male presenting today for concern of rash.  Patient reports that he has had a rash to his bilateral antecubital areas, posterior neck and bilateral ears has been continuous since late February/early March of this year.  Patient was seen at urgent care in March and prescribed prednisone rash continued patient was seen in the ED on 11/02/2018 and evaluated at which time he was prescribed Aquaphor, Atarax, Keflex and encouraged to follow-up with primary care provider.  At that time darkening of skin was suspected from possible recurrent topical steroid use versus acanthosis nigricans.  Chart reviewed.  Patient reports that he is been compliant with the above therapies without improvement.  He describes a itching sensation that occasionally has a mild burning sensation without aggravating or alleviating factors.  Patient denies any change to his lesions since previous presentation.  He denies fever/chills, nausea/vomiting, diarrhea, abdominal pain, vision changes, hearing loss, drainage, oral lesions or any additional concerns.     HPI  Past Medical History:  Diagnosis Date   Blood transfusion without reported diagnosis    states he had to have transfusion d/t gastric ulcer   Gastric ulcer with hemorrhage     There are no active problems to display for this patient.   History reviewed. No pertinent surgical history.      Home Medications    Prior to Admission medications   Medication Sig Start Date End Date Taking? Authorizing Provider  cephALEXin (KEFLEX) 500 MG capsule Take 1 capsule (500 mg total) by mouth 2 (two) times daily for 7 days. 11/02/18 11/09/18  Henderly, Britni A, PA-C  cyclobenzaprine (FLEXERIL) 10 MG tablet Take 10 mg by mouth 3 (three)  times daily as needed for muscle spasms.    [provider]  hydrOXYzine (ATARAX/VISTARIL) 25 MG tablet Take 1 tablet (25 mg total) by mouth every 8 (eight) hours as needed for itching. 11/02/18   Henderly, Britni A, PA-C  ibuprofen (ADVIL,MOTRIN) 200 MG tablet Take 400 mg by mouth every 6 (six) hours as needed.    [provider]  mineral oil-hydrophilic petrolatum (AQUAPHOR) ointment Apply topically 2 (two) times daily as needed for dry skin. 11/02/18   Henderly, Britni A, PA-C  ondansetron (ZOFRAN ODT) 4 MG disintegrating tablet Take 1 tablet (4 mg total) by mouth every 8 (eight) hours as needed for nausea or vomiting. 06/13/18   Alvira Monday, MD  pantoprazole (PROTONIX) 20 MG tablet Take 2 tablets (40 mg total) by mouth daily for 21 days. 06/13/18 07/04/18  Alvira Monday, MD  predniSONE (STERAPRED UNI-PAK 21 TAB) 10 MG (21) TBPK tablet Take by mouth daily. Take as directed. 10/07/18   Mardella Layman, MD    Family History History reviewed. No pertinent family history.  Social History Social History   Tobacco Use   Smoking status: Current Every Day Smoker   Smokeless tobacco: Current User  Substance Use Topics   Alcohol use: No   Drug use: No     Allergies   Patient has no known allergies.   Review of Systems Review of Systems  Constitutional: Negative.  Negative for chills and fever.  HENT: Negative.  Negative for hearing loss, sore throat, trouble swallowing and voice change.   Eyes: Negative.  Negative for  pain and visual disturbance.  Respiratory: Negative.  Negative for cough and shortness of breath.   Gastrointestinal: Negative.  Negative for abdominal pain, nausea and vomiting.  Musculoskeletal: Negative.  Negative for arthralgias, myalgias and neck pain.  Skin: Positive for rash.  Neurological: Negative.  Negative for weakness and headaches.   Physical Exam Updated Vital Signs BP 136/81 (BP Location: Right Arm)    Pulse 91    Temp 98.3 F  (36.8 C) (Oral)    Resp 16    Ht  (1.727 m)    Wt 99.8 kg    SpO2 95%    BMI 33.45 kg/m   Physical Exam Constitutional:      General: He is not in acute distress.    Appearance: Normal appearance. He is well-developed. He is not ill-appearing or diaphoretic.  HENT:     Head: Normocephalic and atraumatic.     Jaw: There is normal jaw occlusion. No trismus.     Right Ear: Tympanic membrane, ear canal and external ear normal. Tympanic membrane is not erythematous.     Left Ear: Tympanic membrane, ear canal and external ear normal. Tympanic membrane is not erythematous.     Ears:     Comments: No vesicles seen within the canal or on tympanic membrane.    Nose: Nose normal.     Mouth/Throat:     Mouth: Mucous membranes are moist.     Pharynx: Oropharynx is clear. Uvula midline.     Comments: No rash, erythema, desquamation or vesicles seen.  Poor dentition. Eyes:     General: Vision grossly intact. Gaze aligned appropriately.     Extraocular Movements: Extraocular movements intact.     Conjunctiva/sclera: Conjunctivae normal.     Pupils: Pupils are equal, round, and reactive to light.     Comments: No pain with extraocular motion, no photophobia  Neck:     Musculoskeletal: Full passive range of motion without pain, normal range of motion and neck supple.     Trachea: Trachea and phonation normal. No tracheal tenderness or tracheal deviation.  Pulmonary:     Effort: Pulmonary effort is normal. No accessory muscle usage or respiratory distress.  Abdominal:     General: There is no distension.     Palpations: Abdomen is soft.     Tenderness: There is no abdominal tenderness. There is no guarding or rebound.  Musculoskeletal: Normal range of motion.  Skin:    General: Skin is warm and dry.     Comments: Mild erythema with excoriations on bilateral antecubital fossa is without evidence of superimposed bacterial infection.  Darkening of the skin of the posterior neck with some  surrounding erythema, scaling and excoriations without evidence of superimposed bacterial infection.  Bilateral ears with darker pigmentation compared to scalp, appears similar in color to posterior neck.  No tenderness to palpation, fluctuance or induration present.  Well vascularized warm to touch.  Neurological:     Mental Status: He is alert.     GCS: GCS eye subscore is 4. GCS verbal subscore is 5. GCS motor subscore is 6.     Comments: Speech is clear and goal oriented, follows commands Major Cranial nerves without deficit, no facial droop Moves extremities without ataxia, coordination intact Normal gait  Psychiatric:        Behavior: Behavior normal.                ED Treatments / Results  Labs (all labs ordered are listed, but only  abnormal results are displayed) Labs Reviewed - No data to display  EKG None  Radiology No results found.  Procedures Procedures (including critical care time)  Medications Ordered in ED Medications - No data to display   Initial Impression / Assessment and Plan / ED Course  I have reviewed the triage vital signs and the nursing notes.  Pertinent labs & imaging results that were available during my care of the patient were reviewed by me and considered in my medical decision making (see chart for details).    58 year old male presenting with 07-6423-month history of rash, appears as eczema and/or acanthus nigricans.  The darkening of patient's ears appears to be hyperpigmentation. He has been treated with steroids, antibiotics, Aquaphor and Vistaril with minimal relief.  Patient denies any difficulty breathing or swallowing.  Pt has a patent airway without stridor and is handling secretions without difficulty; no angioedema. No blisters, no pustules, no warmth, no draining sinus tracts, no superficial abscesses, no bullous impetigo, no vesicles, no desquamation, no target lesions with dusky purpura or a central bulla. Not tender to  touch. No concern for superimposed infection. No concern for SJS, TEN, TSS, tick borne illness, syphilis or other life-threatening condition.  Images obtained today reviewed with Dr. Rosalia Hammersay.  Plan of care at this time is to continue patient on Aquaphor cream and have follow-up with PCP and dermatology.  No on-call dermatology or referral available in ED, patient encouraged to contact his primary care provider for dermatology referral.  At this time there does not appear to be any evidence of an acute emergency medical condition and the patient appears stable for discharge with appropriate outpatient follow up. Diagnosis was discussed with patient who verbalizes understanding of care plan and is agreeable to discharge. I have discussed return precautions with patient who verbalizes understanding of return precautions. Patient encouraged to follow-up with their PCP. All questions answered.  Patient has been discharged in good condition.  Note: Portions of this report may have been transcribed using voice recognition software. Every effort was made to ensure accuracy; however, inadvertent computerized transcription errors may still be present.  Final Clinical Impressions(s) / ED Diagnoses   Final diagnoses:  Rash  Flexural eczema    ED Discharge Orders    None       Elizabeth PalauMorelli, Charlton Boule A, PA-C 11/09/18 1440    Margarita Grizzleay, Danielle, MD 11/13/18 [phone]

## 2019-02-19 ENCOUNTER — Other Ambulatory Visit: Payer: Self-pay

## 2019-02-19 ENCOUNTER — Ambulatory Visit (INDEPENDENT_AMBULATORY_CARE_PROVIDER_SITE_OTHER): Payer: Managed Care, Other (non HMO) | Admitting: Internal Medicine

## 2019-02-19 ENCOUNTER — Encounter: Payer: Self-pay | Admitting: Internal Medicine

## 2019-02-19 VITALS — BP 110/80 | HR 90 | Temp 97.9°F | Ht 68.5 in | Wt 204.2 lb

## 2019-02-19 DIAGNOSIS — K279 Peptic ulcer, site unspecified, unspecified as acute or chronic, without hemorrhage or perforation: Secondary | ICD-10-CM | POA: Diagnosis not present

## 2019-02-19 DIAGNOSIS — Z1211 Encounter for screening for malignant neoplasm of colon: Secondary | ICD-10-CM

## 2019-02-19 DIAGNOSIS — Z23 Encounter for immunization: Secondary | ICD-10-CM | POA: Diagnosis not present

## 2019-02-19 DIAGNOSIS — R5383 Other fatigue: Secondary | ICD-10-CM

## 2019-02-19 DIAGNOSIS — M25511 Pain in right shoulder: Secondary | ICD-10-CM | POA: Diagnosis not present

## 2019-02-19 DIAGNOSIS — Z720 Tobacco use: Secondary | ICD-10-CM

## 2019-02-19 DIAGNOSIS — E669 Obesity, unspecified: Secondary | ICD-10-CM

## 2019-02-19 DIAGNOSIS — E66811 Obesity, class 1: Secondary | ICD-10-CM

## 2019-02-19 DIAGNOSIS — G4730 Sleep apnea, unspecified: Secondary | ICD-10-CM

## 2019-02-19 MED ORDER — PANTOPRAZOLE SODIUM 40 MG PO TBEC: 40.0000 mg | DELAYED_RELEASE_TABLET | Freq: Every day | ORAL | 1 refills | Status: DC

## 2019-02-19 NOTE — Addendum Note (Signed)
Addended by: Westley Hummer B on: 02/19/2019 05:20 PM   Modules accepted: Orders

## 2019-02-19 NOTE — Addendum Note (Signed)
Addended by: Westley Hummer B on: 02/19/2019 05:10 PM   Modules accepted: Orders

## 2019-02-19 NOTE — Progress Notes (Signed)
New Patient Office Visit     CC/Reason for Visit: Establish care, discuss some acute conditions Previous PCP: Unknown Last Visit: Many years  HPI: Christian Howard is a 58 y.o. male who is coming in today for the above mentioned reasons.  He is here to establish care at the urging of his wife.  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  [place]  He is an ongoing smoker who smokes 1 pack a day and has done so since age 58, drinks alcohol only occasionally.  He has no known drug allergies, takes no chronic medications.  His family history significant for a brother who recently passed at age 655 from stage IV cancer, he does not know the primary site.  In the late 90s he was hospitalized due to peptic ulcer disease.  States he was transfused 12 units of PRBCs.  Is currently not taking any medications for it but has recently noticed that it is "flaring up".  He has been complaining of some right shoulder pain worse while he is hauling boxes at work.  He has been taking a lot of Aleve for this.  His wife is concerned that he might have sleep apnea, he snores a lot, he finds himself dozing off at times at work.  He is requesting flu and tetanus immunizations today.   Past Medical/Surgical History: Past Medical History:  Diagnosis Date  . Blood transfusion without reported diagnosis    states he had to have transfusion d/t gastric ulcer  . Gastric ulcer with hemorrhage     History reviewed. No pertinent surgical history.  Social History:  reports that he has been smoking cigarettes. He has a 43.00 pack-year smoking history. He uses smokeless tobacco. He reports current alcohol use. He reports that he does not use drugs.  Allergies: No Known Allergies  Family History:  Family History  Problem Relation Age of Onset  . Cancer Sister      Current Outpatient Medications:  .  triamcinolone ointment (KENALOG) 0.1 %, APPLY  OINTMENT TOPICALLY TWICE DAILY TAPER USE AS ABLE, Disp: , Rfl:  .  pantoprazole (PROTONIX) 40 MG tablet, Take 1 tablet (40 mg total) by mouth daily., Disp: 90 tablet, Rfl: 1  Review of Systems:  Constitutional: Denies fever, chills, diaphoresis, appetite change and fatigue.  HEENT: Denies photophobia, eye pain, redness, hearing loss, ear pain, congestion, sore throat, rhinorrhea, sneezing, mouth sores, trouble swallowing, neck pain, neck stiffness and tinnitus.   Respiratory: Denies SOB, DOE, cough, chest tightness,  and wheezing.   Cardiovascular: Denies chest pain, palpitations and leg swelling.  Gastrointestinal: Denies nausea, vomiting, diarrhea, constipation, blood in stool and abdominal distention.  Genitourinary: Denies dysuria, urgency, frequency, hematuria, flank pain and difficulty urinating.  Endocrine: Denies: hot or cold intolerance, sweats, changes in hair or nails, polyuria, polydipsia. Musculoskeletal: Denies myalgias, back pain, joint swelling, arthralgias and gait problem.  Skin: Denies pallor, rash and wound.  Neurological: Denies dizziness, seizures, syncope, weakness, light-headedness, numbness and headaches.  Hematological: Denies adenopathy. Easy bruising, personal or family bleeding history  Psychiatric/Behavioral: Denies suicidal ideation, mood changes, confusion, nervousness, sleep disturbance and agitation    Physical Exam: Vitals:   02/19/19 1325  BP: 110/80  Pulse: 90  Temp: 97.9 F (36.6 C)  TempSrc: Temporal  SpO2: 95%  Weight: 204 lb 3.2 oz (92.6 kg)  Height: 5' 8.5" (1.74 m)   Body mass index is 30.6 kg/m.  Constitutional: NAD,  calm, comfortable Eyes: PERRL, lids and conjunctivae normal ENMT: Mucous membranes are moist. Respiratory: clear to auscultation bilaterally, no wheezing, no crackles. Normal respiratory effort. No accessory muscle use.  Cardiovascular: Regular rate and rhythm, no murmurs / rubs / gallops. No extremity edema. 2+ pedal  pulses. No carotid bruits.  Abdomen: no tenderness, no masses palpated. No hepatosplenomegaly. Bowel sounds positive.  Musculoskeletal: no clubbing / cyanosis. No joint deformity upper and lower extremities. Good ROM, no contractures. Normal muscle tone.  Skin: no rashes, lesions, ulcers. No induration Neurologic: Grossly intact and nonfocal  Psychiatric: Normal judgment and insight. Alert and oriented x 3. Normal mood.    Impression and Plan:  PUD (peptic ulcer disease)  -He has had what appears to be a gastric ulcer with hemorrhage remotely. -He has been advised to immediately stop use of all NSAIDs. -Will prescribe Protonix 40 mg daily. -Have advised that alcohol, tobacco and caffeine use may exacerbate. -Referral to GI has been placed.  Acute pain of right shoulder -Suspect this likely represents bursitis, will refer to sports medicine, may benefit from cortisone injection.  Fatigue -With his body habitus, snoring and fatigue while at work, suspect he may have sleep apnea. -Will refer to pulmonary for further evaluation.  Obesity (BMI 30.0-34.9) -Discussed healthy lifestyle, including increased physical activity and better food choices to promote weight loss.  Tobacco abuse -I have discussed tobacco cessation with the patient.  I have counseled the patient regarding the negative impacts of continued tobacco use including but not limited to lung cancer, COPD, and cardiovascular disease.  I have discussed alternatives to tobacco and modalities that may help facilitate tobacco cessation including but not limited to biofeedback, hypnosis, and medications.  Total time spent with tobacco counseling was 3 minutes.      Patient Instructions  -Nice seeing you today!!  -Flu and tetanus vaccines today.  -Referrals to pulmonary, GI and sports medicine today.  -START taking Protonix 40 mg daily.  -STOP taking Aleve and any other OTC pain meds with the exception of Tylenol as they  are flaring up your stomach ulcer.   Smoking Tobacco Information, Adult Smoking tobacco can be harmful to your health. Tobacco contains a poisonous (toxic), colorless chemical called nicotine. Nicotine is addictive. It changes the brain and can make it hard to stop smoking. Tobacco also has other toxic chemicals that can hurt your body and raise your risk of many cancers. How can smoking tobacco affect me? Smoking tobacco puts you at risk for:  Cancer. Smoking is most commonly associated with lung cancer, but can also lead to cancer in other parts of the body.  Chronic obstructive pulmonary disease (COPD). This is a long-term lung condition that makes it hard to breathe. It also gets worse over time.  High blood pressure (hypertension), heart disease, stroke, or heart attack.  Lung infections, such as pneumonia.  Cataracts. This is when the lenses in the eyes become clouded.  Digestive problems. This may include peptic ulcers, heartburn, and gastroesophageal reflux disease (GERD).  Oral health problems, such as gum disease and tooth loss.  Loss of taste and smell. Smoking can affect your appearance by causing:  Wrinkles.  Yellow or stained teeth, fingers, and fingernails. Smoking tobacco can also affect your social life, because:  It may be challenging to find places to smoke when away from home. Many workplaces, [employer], hotels, and public places are tobacco-free.  Smoking is expensive. This is due to the cost of tobacco and the long-term costs  of treating health problems from smoking.  Secondhand smoke may affect those around you. Secondhand smoke can cause lung cancer, breathing problems, and heart disease. Children of smokers have a higher risk for: ? Sudden infant death syndrome (SIDS). ? Ear infections. ? Lung infections. If you currently smoke tobacco, quitting now can help you:  Lead a longer and healthier life.  Look, smell, breathe, and feel better over time.   Save money.  Protect others from the harms of secondhand smoke. What actions can I take to prevent health problems? Quit smoking   Do not start smoking. Quit if you already do.  Make a plan to quit smoking and commit to it. Look for programs to help you and ask your health care provider for recommendations and ideas.  Set a date and write down all the reasons you want to quit.  Let your friends and family know you are quitting so they can help and support you. Consider finding friends who also want to quit. It can be easier to quit with someone else, so that you can support each other.  Talk with your health care provider about using nicotine replacement medicines to help you quit, such as gum, lozenges, patches, sprays, or pills.  Do not replace cigarette smoking with electronic cigarettes, which are commonly called e-cigarettes. The safety of e-cigarettes is not known, and some may contain harmful chemicals.  If you try to quit but return to smoking, stay positive. It is common to slip up when you first quit, so take it one day at a time.  Be prepared for cravings. When you feel the urge to smoke, chew gum or suck on hard candy. Lifestyle  Stay busy and take care of your body.  Drink enough fluid to keep your urine pale yellow.  Get plenty of exercise and eat a healthy diet. This can help prevent weight gain after quitting.  Monitor your eating habits. Quitting smoking can cause you to have a larger appetite than when you smoke.  Find ways to relax. Go out with friends or family to a movie or a restaurant where people do not smoke.  Ask your health care provider about having regular tests (screenings) to check for cancer. This may include blood tests, imaging tests, and other tests.  Find ways to manage your stress, such as meditation, yoga, or exercise. Where to find support To get support to quit smoking, consider:  Asking your health care provider for more information  and resources.  Taking classes to learn more about quitting smoking.  Looking for local organizations that offer resources about quitting smoking.  Joining a support group for people who want to quit smoking in your local community.  Calling the smokefree.gov counselor helpline: 1-800-Quit-Now (458)745-1027) Where to find more information You may find more information about quitting smoking from:  HelpGuide.org: www.helpguide.org  BankRights.uy: smokefree.gov  American Lung Association: www.lung.org Contact a health care provider if you:  Have problems breathing.  Notice that your lips, nose, or fingers turn blue.  Have chest pain.  Are coughing up blood.  Feel faint or you pass out.  Have other health changes that cause you to worry. Summary  Smoking tobacco can negatively affect your health, the health of those around you, your finances, and your social life.  Do not start smoking. Quit if you already do. If you need help quitting, ask your health care provider.  Think about joining a support group for people who want to quit smoking in  your local community. There are many effective programs that will help you to quit this behavior. This information is not intended to replace advice given to you by your health care provider. Make sure you discuss any questions you have with your health care provider. Document Released: 06/19/2016 Document Revised: 07/24/2017 Document Reviewed: 06/19/2016 Elsevier Patient Education  2020 Elsevier Inc.      Chaya JanEstela Hernandez Acosta, MD [facility]

## 2019-02-19 NOTE — Patient Instructions (Signed)
-Nice seeing you today!!  -Flu and tetanus vaccines today.  -Referrals to pulmonary, GI and sports medicine today.  -START taking Protonix 40 mg daily.  -STOP taking Aleve and any other OTC pain meds with the exception of Tylenol as they are flaring up your stomach ulcer.   Smoking Tobacco Information, Adult Smoking tobacco can be harmful to your health. Tobacco contains a poisonous (toxic), colorless chemical called nicotine. Nicotine is addictive. It changes the brain and can make it hard to stop smoking. Tobacco also has other toxic chemicals that can hurt your body and raise your risk of many cancers. How can smoking tobacco affect me? Smoking tobacco puts you at risk for:  Cancer. Smoking is most commonly associated with lung cancer, but can also lead to cancer in other parts of the body.  Chronic obstructive pulmonary disease (COPD). This is a long-term lung condition that makes it hard to breathe. It also gets worse over time.  High blood pressure (hypertension), heart disease, stroke, or heart attack.  Lung infections, such as pneumonia.  Cataracts. This is when the lenses in the eyes become clouded.  Digestive problems. This may include peptic ulcers, heartburn, and gastroesophageal reflux disease (GERD).  Oral health problems, such as gum disease and tooth loss.  Loss of taste and smell. Smoking can affect your appearance by causing:  Wrinkles.  Yellow or stained teeth, fingers, and fingernails. Smoking tobacco can also affect your social life, because:  It may be challenging to find places to smoke when away from home. Many workplaces, [employer], hotels, and public places are tobacco-free.  Smoking is expensive. This is due to the cost of tobacco and the long-term costs of treating health problems from smoking.  Secondhand smoke may affect those around you. Secondhand smoke can cause lung cancer, breathing problems, and heart disease. Children of smokers have a  higher risk for: ? Sudden infant death syndrome (SIDS). ? Ear infections. ? Lung infections. If you currently smoke tobacco, quitting now can help you:  Lead a longer and healthier life.  Look, smell, breathe, and feel better over time.  Save money.  Protect others from the harms of secondhand smoke. What actions can I take to prevent health problems? Quit smoking   Do not start smoking. Quit if you already do.  Make a plan to quit smoking and commit to it. Look for programs to help you and ask your health care provider for recommendations and ideas.  Set a date and write down all the reasons you want to quit.  Let your friends and family know you are quitting so they can help and support you. Consider finding friends who also want to quit. It can be easier to quit with someone else, so that you can support each other.  Talk with your health care provider about using nicotine replacement medicines to help you quit, such as gum, lozenges, patches, sprays, or pills.  Do not replace cigarette smoking with electronic cigarettes, which are commonly called e-cigarettes. The safety of e-cigarettes is not known, and some may contain harmful chemicals.  If you try to quit but return to smoking, stay positive. It is common to slip up when you first quit, so take it one day at a time.  Be prepared for cravings. When you feel the urge to smoke, chew gum or suck on hard candy. Lifestyle  Stay busy and take care of your body.  Drink enough fluid to keep your urine pale yellow.  Get plenty  of exercise and eat a healthy diet. This can help prevent weight gain after quitting.  Monitor your eating habits. Quitting smoking can cause you to have a larger appetite than when you smoke.  Find ways to relax. Go out with friends or family to a movie or a restaurant where people do not smoke.  Ask your health care provider about having regular tests (screenings) to check for cancer. This may  include blood tests, imaging tests, and other tests.  Find ways to manage your stress, such as meditation, yoga, or exercise. Where to find support To get support to quit smoking, consider:  Asking your health care provider for more information and resources.  Taking classes to learn more about quitting smoking.  Looking for local organizations that offer resources about quitting smoking.  Joining a support group for people who want to quit smoking in your local community.  Calling the smokefree.gov counselor helpline: 1-800-Quit-Now (626)638-5149(1-351-771-3893) Where to find more information You may find more information about quitting smoking from:  HelpGuide.org: www.helpguide.org  BankRights.uySmokefree.gov: smokefree.gov  American Lung Association: www.lung.org Contact a health care provider if you:  Have problems breathing.  Notice that your lips, nose, or fingers turn blue.  Have chest pain.  Are coughing up blood.  Feel faint or you pass out.  Have other health changes that cause you to worry. Summary  Smoking tobacco can negatively affect your health, the health of those around you, your finances, and your social life.  Do not start smoking. Quit if you already do. If you need help quitting, ask your health care provider.  Think about joining a support group for people who want to quit smoking in your local community. There are many effective programs that will help you to quit this behavior. This information is not intended to replace advice given to you by your health care provider. Make sure you discuss any questions you have with your health care provider. Document Released: 06/19/2016 Document Revised: 07/24/2017 Document Reviewed: 06/19/2016 Elsevier Patient Education  2020 [employer]

## 2019-02-20 ENCOUNTER — Encounter: Payer: Self-pay | Admitting: Gastroenterology

## 2019-02-24 ENCOUNTER — Telehealth: Payer: Self-pay | Admitting: *Deleted

## 2019-02-24 NOTE — Telephone Encounter (Signed)
Copied from [place] [id number]. Topic: General - Other >> Feb 24, 2019 12:21 PM Leward Quan A wrote: Reason for CRM: Patient would like a call back to schedule a follow up appointment to see Dr Jerilee Hoh today stated that he went to the hospital because of his BP. Please call patient at Ph# [phone] >> Feb 24, 2019  1:32 PM Cox, [place] H, CMA wrote: She does not have any openings. Please advise.

## 2019-02-24 NOTE — Telephone Encounter (Signed)
Left message on machine for patient to return our call to schedule a hospital follow up appointment.   CRM

## 2019-02-26 NOTE — Telephone Encounter (Signed)
Appointment scheduled.

## 2019-03-04 ENCOUNTER — Inpatient Hospital Stay: Payer: Managed Care, Other (non HMO) | Admitting: Internal Medicine

## 2019-03-13 ENCOUNTER — Telehealth: Payer: Self-pay | Admitting: *Deleted

## 2019-03-13 NOTE — Telephone Encounter (Signed)
Returning your call. °

## 2019-03-13 NOTE — Telephone Encounter (Signed)
No Show PV today. Patient called, no answer,left message for him to call us back before 5 pm today to reschedule the pv.

## 2019-03-13 NOTE — Telephone Encounter (Signed)
Pt called again. No answer, left message to call us back before 5 pm. No show letter will be mailed to patient if he does not call us back and colon/PV will be cancelled.

## 2019-03-17 ENCOUNTER — Ambulatory Visit: Payer: Self-pay

## 2019-03-17 ENCOUNTER — Other Ambulatory Visit: Payer: Self-pay

## 2019-03-17 ENCOUNTER — Ambulatory Visit (INDEPENDENT_AMBULATORY_CARE_PROVIDER_SITE_OTHER): Payer: Managed Care, Other (non HMO) | Admitting: Orthopaedic Surgery

## 2019-03-17 ENCOUNTER — Encounter: Payer: Self-pay | Admitting: Orthopaedic Surgery

## 2019-03-17 VITALS — BP 158/86 | HR 93 | Ht 68.5 in | Wt 220.0 lb

## 2019-03-17 DIAGNOSIS — G8929 Other chronic pain: Secondary | ICD-10-CM | POA: Diagnosis not present

## 2019-03-17 DIAGNOSIS — M25511 Pain in right shoulder: Secondary | ICD-10-CM

## 2019-03-17 MED ORDER — LIDOCAINE HCL 2 % IJ SOLN
2.0000 mL | INTRAMUSCULAR | Status: AC | PRN
  Administered 2019-03-17: 2 mL

## 2019-03-17 MED ORDER — BUPIVACAINE HCL 0.5 % IJ SOLN
2.0000 mL | INTRAMUSCULAR | Status: AC | PRN
  Administered 2019-03-17: 14:00:00 2 mL via INTRA_ARTICULAR

## 2019-03-17 MED ORDER — METHYLPREDNISOLONE ACETATE 40 MG/ML IJ SUSP
80.0000 mg | INTRAMUSCULAR | Status: AC | PRN
  Administered 2019-03-17: 80 mg via INTRA_ARTICULAR

## 2019-03-17 NOTE — Progress Notes (Signed)
Office Visit Note   Patient: Christian Howard           Date of Birth: 08-01-1960           MRN: [id number] Visit Date: 03/17/2019              Requested by: Philip Aspen, Limmie Patricia, MD [address] [place],  [place] [address] PCP: Philip Aspen, Limmie Patricia, MD   Assessment & Plan: Visit Diagnoses:  1. Chronic right shoulder pain     Plan: Impingement syndrome right shoulder with the possibility of an underlying rotator cuff tear.  Will inject subacromial space with cortisone and monitor response.  If no improvement over the next 3 to 4 weeks would suggest an MRI scan  Follow-Up Instructions: Return if symptoms worsen or fail to improve.   Orders:  Orders Placed This Encounter  Procedures  . Large Joint Inj: R subacromial bursa  . XR Shoulder Right   No orders of the defined types were placed in this encounter.     Procedures: Large Joint Inj: R subacromial bursa on 03/17/2019 2:11 PM Indications: pain and diagnostic evaluation Details: 25 G 1.5 in needle, anterolateral approach  Arthrogram: No  Medications: 2 mL lidocaine 2 %; 2 mL bupivacaine 0.5 %; 80 mg methylPREDNISolone acetate 40 MG/ML Consent was given by the patient. Immediately prior to procedure a time out was called to verify the correct patient, procedure, equipment, support staff and site/side marked as required. Patient was prepped and draped in the usual sterile fashion.       Clinical Data: No additional findings.   Subjective: Chief Complaint  Patient presents with  . Right Shoulder - Pain  Patient presents today with right shoulder pain X3 months. He lifts boxes a lot for his job. His pain is located on the superior aspect of his shoulder and radiates into his neck area. No pain travels down his arm. No weakness, numbness, or tingling in his arms. He said that his shoulder feels stiff and has limited range of motion. He takes aspirin for pain. He is right hand dominant.   HPI  Review  of Systems  Constitutional: Negative for fatigue.  HENT: Negative for ear pain.   Eyes: Negative for pain.  Respiratory: Negative for shortness of breath.   Cardiovascular: Negative for leg swelling.  Gastrointestinal: Negative for constipation and diarrhea.  Endocrine: Negative for cold intolerance and heat intolerance.  Genitourinary: Negative for difficulty urinating.  Musculoskeletal: Negative for joint swelling.  Skin: Negative for rash.  Allergic/Immunologic: Negative for food allergies.  Neurological: Negative for weakness.  Hematological: Does not bruise/bleed easily.  Psychiatric/Behavioral: Negative for sleep disturbance.     Objective: Vital Signs: BP (!) 158/86   Pulse 93   Ht 5' 8.5" (1.74 m)   Wt 220 lb (99.8 kg)   BMI 32.96 kg/m   Physical Exam Constitutional:      Appearance: He is well-developed.  Eyes:     Pupils: Pupils are equal, round, and reactive to light.  Pulmonary:     Effort: Pulmonary effort is normal.  Skin:    General: Skin is warm and dry.  Neurological:     Mental Status: He is alert and oriented to person, place, and time.  Psychiatric:        Behavior: Behavior normal.     Ortho Exam awake alert and oriented x3.  Comfortable sitting.  Difficulty raising right arm overhead related to pain.  Has good strength.  I  could abduct to 90 degrees without much discomfort.  Positive empty can and impingement testing.  Good strength.  Biceps intact.  Good grip and release.  No neck pain  Specialty Comments:  No specialty comments available.  Imaging: Xr Shoulder Right  Result Date: 03/17/2019 Films of the right shoulder obtained in 4 projections.  The humeral head is centered about the glenoid.  Normal space between the humeral head and the acromion.  There is some lateral downsloping the acromion.  There are significant degenerative changes of the chromic clavicular joint with spur formation and erosive changes.  No ectopic calcification.  No  acute changes    PMFS History: Patient Active Problem List   Diagnosis Date Noted  . PUD (peptic ulcer disease) 02/19/2019  . Pain in right shoulder 02/19/2019  . Fatigue 02/19/2019  . Obesity (BMI 30.0-34.9) 02/19/2019  . Tobacco abuse 02/19/2019   Past Medical History:  Diagnosis Date  . Blood transfusion without reported diagnosis    states he had to have transfusion d/t gastric ulcer  . Gastric ulcer with hemorrhage     Family History  Problem Relation Age of Onset  . Cancer Sister     History reviewed. No pertinent surgical history. Social History   Occupational History  . Not on file  Tobacco Use  . Smoking status: Current Every Day Smoker    Packs/day: 1.00    Years: 43.00    Pack years: 43.00    Types: Cigarettes  . Smokeless tobacco: Current User  Substance and Sexual Activity  . Alcohol use: Yes    Comment: occasional  . Drug use: No  . Sexual activity: Yes

## 2019-03-19 ENCOUNTER — Encounter: Payer: Self-pay | Admitting: Gastroenterology

## 2019-03-27 ENCOUNTER — Encounter: Payer: Managed Care, Other (non HMO) | Admitting: Gastroenterology

## 2019-04-24 ENCOUNTER — Telehealth: Payer: Self-pay | Admitting: *Deleted

## 2019-04-24 NOTE — Telephone Encounter (Signed)
Pt no showed PV today at 230 pm -- I called pt , LM to return call- LM that he needed to call by 5 pm to RS   512 pm- pt did not call and RS   Mailed NS letter- canceled the PV and colon

## 2019-05-08 ENCOUNTER — Encounter: Payer: Managed Care, Other (non HMO) | Admitting: Gastroenterology

## 2019-05-21 ENCOUNTER — Other Ambulatory Visit: Payer: Self-pay

## 2019-05-21 ENCOUNTER — Encounter: Payer: Self-pay | Admitting: Internal Medicine

## 2019-05-21 ENCOUNTER — Ambulatory Visit (INDEPENDENT_AMBULATORY_CARE_PROVIDER_SITE_OTHER): Payer: Managed Care, Other (non HMO) | Admitting: Internal Medicine

## 2019-05-21 VITALS — BP 122/84 | HR 99 | Temp 98.7°F | Ht 67.75 in | Wt 209.0 lb

## 2019-05-21 DIAGNOSIS — E669 Obesity, unspecified: Secondary | ICD-10-CM | POA: Diagnosis not present

## 2019-05-21 DIAGNOSIS — K279 Peptic ulcer, site unspecified, unspecified as acute or chronic, without hemorrhage or perforation: Secondary | ICD-10-CM | POA: Diagnosis not present

## 2019-05-21 DIAGNOSIS — E66811 Obesity, class 1: Secondary | ICD-10-CM

## 2019-05-21 DIAGNOSIS — R5383 Other fatigue: Secondary | ICD-10-CM

## 2019-05-21 DIAGNOSIS — F17210 Nicotine dependence, cigarettes, uncomplicated: Secondary | ICD-10-CM

## 2019-05-21 DIAGNOSIS — Z720 Tobacco use: Secondary | ICD-10-CM | POA: Diagnosis not present

## 2019-05-21 DIAGNOSIS — Z0000 Encounter for general adult medical examination without abnormal findings: Secondary | ICD-10-CM | POA: Diagnosis not present

## 2019-05-21 DIAGNOSIS — F1490 Cocaine use, unspecified, uncomplicated: Secondary | ICD-10-CM

## 2019-05-21 DIAGNOSIS — Z23 Encounter for immunization: Secondary | ICD-10-CM | POA: Diagnosis not present

## 2019-05-21 MED ORDER — BUPROPION HCL ER (XL) 150 MG PO TB24: 150.0000 mg | ORAL_TABLET | Freq: Every day | ORAL | 1 refills | Status: DC

## 2019-05-21 NOTE — Patient Instructions (Signed)
-Nice seeing you today!!  -Lab work today; will notify you once results are available.  -Start Wellbutrin 150 mg daily for smoking cessation. Also make sure you schedule appointment for counseling sessions. Christian Howard is available in our office.  -First shingles vaccines. Second when you return in 3 months.  -Schedule follow up in 3 months.   Preventive Care 38-59 Years Old, Male Preventive care refers to lifestyle choices and visits with your health care provider that can promote health and wellness. This includes:  A yearly physical exam. This is also called an annual well check.  Regular dental and eye exams.  Immunizations.  Screening for certain conditions.  Healthy lifestyle choices, such as eating a healthy diet, getting regular exercise, not using drugs or products that contain nicotine and tobacco, and limiting alcohol use. What can I expect for my preventive care visit? Physical exam Your health care provider will check:  Height and weight. These may be used to calculate body mass index (BMI), which is a measurement that tells if you are at a healthy weight.  Heart rate and blood pressure.  Your skin for abnormal spots. Counseling Your health care provider may ask you questions about:  Alcohol, tobacco, and drug use.  Emotional well-being.  Home and relationship well-being.  Sexual activity.  Eating habits.  Work and work Statistician. What immunizations do I need?  Influenza (flu) vaccine  This is recommended every year. Tetanus, diphtheria, and pertussis (Tdap) vaccine  You may need a Td booster every 10 years. Varicella (chickenpox) vaccine  You may need this vaccine if you have not already been vaccinated. Zoster (shingles) vaccine  You may need this after age 30. Measles, mumps, and rubella (MMR) vaccine  You may need at least one dose of MMR if you were born in 1957 or later. You may also need a second dose. Pneumococcal conjugate  (PCV13) vaccine  You may need this if you have certain conditions and were not previously vaccinated. Pneumococcal polysaccharide (PPSV23) vaccine  You may need one or two doses if you smoke cigarettes or if you have certain conditions. Meningococcal conjugate (MenACWY) vaccine  You may need this if you have certain conditions. Hepatitis A vaccine  You may need this if you have certain conditions or if you travel or work in places where you may be exposed to hepatitis A. Hepatitis B vaccine  You may need this if you have certain conditions or if you travel or work in places where you may be exposed to hepatitis B. Haemophilus influenzae type b (Hib) vaccine  You may need this if you have certain risk factors. Human papillomavirus (HPV) vaccine  If recommended by your health care provider, you may need three doses over 6 months. You may receive vaccines as individual doses or as more than one vaccine together in one shot (combination vaccines). Talk with your health care provider about the risks and benefits of combination vaccines. What tests do I need? Blood tests  Lipid and cholesterol levels. These may be checked every 5 years, or more frequently if you are over 59 years old.  Hepatitis C test.  Hepatitis B test. Screening  Lung cancer screening. You may have this screening every year starting at age 75 if you have a 30-pack-year history of smoking and currently smoke or have quit within the past 15 years.  Prostate cancer screening. Recommendations will vary depending on your family history and other risks.  Colorectal cancer screening. All adults should have this  screening starting at age 34 and continuing until age 4. Your health care provider may recommend screening at age 61 if you are at increased risk. You will have tests every 1-10 years, depending on your results and the type of screening test.  Diabetes screening. This is done by checking your blood sugar (glucose)  after you have not eaten for a while (fasting). You may have this done every 1-3 years.  Sexually transmitted disease (STD) testing. Follow these instructions at home: Eating and drinking  Eat a diet that includes fresh fruits and vegetables, whole grains, lean protein, and low-fat dairy products.  Take vitamin and mineral supplements as recommended by your health care provider.  Do not drink alcohol if your health care provider tells you not to drink.  If you drink alcohol: ? Limit how much you have to 0-2 drinks a day. ? Be aware of how much alcohol is in your drink. In the [place] one drink equals one 12 oz bottle of beer (355 mL), one 5 oz glass of wine (148 mL), or one 1 oz glass of hard liquor (44 mL). Lifestyle  Take daily care of your teeth and gums.  Stay active. Exercise for at least 30 minutes on 5 or more days each week.  Do not use any products that contain nicotine or tobacco, such as cigarettes, e-cigarettes, and chewing tobacco. If you need help quitting, ask your health care provider.  If you are sexually active, practice safe sex. Use a condom or other form of protection to prevent STIs (sexually transmitted infections).  Talk with your health care provider about taking a low-dose aspirin every day starting at age 69. What's next?  Go to your health care provider once a year for a well check visit.  Ask your health care provider how often you should have your eyes and teeth checked.  Stay up to date on all vaccines. This information is not intended to replace advice given to you by your health care provider. Make sure you discuss any questions you have with your health care provider. Document Released: 07/01/2015 Document Revised: 05/29/2018 Document Reviewed: 05/29/2018 Elsevier Patient Education  2020 [employer].

## 2019-05-21 NOTE — Progress Notes (Signed)
Established Patient Office Visit     This visit occurred during the SARS-CoV-2 public health emergency.  Safety protocols were in place, including screening questions prior to the visit, additional usage of staff PPE, and extensive cleaning of exam room while observing appropriate contact time as indicated for disinfecting solutions.    CC/Reason for Visit: Annual Preventive Exam  HPI: Christian Howard is a 58 y.o. male who is coming in today for the above mentioned reasons. Past Medical History is significant for: Tobacco abuse of about half pack a day and history of peptic ulcer disease in the 90s for which he still takes PPI therapy.  He tells me that back in September he had a passing out episode while in [place] was taken to the hospital by EMS and was told he had high sugar and high blood pressure but they could never find out the reason for his syncopal event.  Upon review of care everywhere I do see where he was cocaine positive and it was thought that he had had an unintentional cocaine overdose contributing to his symptoms.  He denies cocaine use in office today.  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  He is due for shingles vaccination today.  GI referral has been placed for screening colonoscopy.   Past Medical/Surgical History: Past Medical History:  Diagnosis Date  . Blood transfusion without reported diagnosis    states he had to have transfusion d/t gastric ulcer  . Gastric ulcer with hemorrhage     History reviewed. No pertinent surgical history.  Social History:  reports that he has been smoking cigarettes. He has a 43.00 pack-year smoking history. He uses smokeless tobacco. He reports current alcohol use. He reports that he does not use drugs.  Allergies: No Known Allergies  Family History:  Family History  Problem Relation Age of Onset   . Cancer Sister      Current Outpatient Medications:  .  aspirin EC 81 MG tablet, Take 81 mg by mouth daily., Disp: , Rfl:  .  pantoprazole (PROTONIX) 40 MG tablet, Take 1 tablet (40 mg total) by mouth daily., Disp: 90 tablet, Rfl: 1 .  triamcinolone ointment (KENALOG) 0.1 %, APPLY OINTMENT TOPICALLY TWICE DAILY TAPER USE AS ABLE, Disp: , Rfl:  .  buPROPion (WELLBUTRIN XL) 150 MG 24 hr tablet, Take 1 tablet (150 mg total) by mouth daily., Disp: 90 tablet, Rfl: 1  Review of Systems:  Constitutional: Denies fever, chills, diaphoresis, appetite change and fatigue.  HEENT: Denies photophobia, eye pain, redness, hearing loss, ear pain, congestion, sore throat, rhinorrhea, sneezing, mouth sores, trouble swallowing, neck pain, neck stiffness and tinnitus.   Respiratory: Denies SOB, DOE, cough, chest tightness,  and wheezing.   Cardiovascular: Denies chest pain, palpitations and leg swelling.  Gastrointestinal: Denies nausea, vomiting, abdominal pain, diarrhea, constipation, blood in stool and abdominal distention.  Genitourinary: Denies dysuria, urgency, frequency, hematuria, flank pain and difficulty urinating.  Endocrine: Denies: hot or cold intolerance, sweats, changes in hair or nails, polyuria, polydipsia. Musculoskeletal: Denies myalgias, back pain, joint swelling, arthralgias and gait problem.  Skin: Denies pallor, rash and wound.  Neurological: Denies dizziness, seizures, syncope, weakness, light-headedness, numbness and headaches.  Hematological: Denies adenopathy. Easy bruising, personal or family bleeding history  Psychiatric/Behavioral: Denies suicidal ideation, mood changes, confusion, nervousness, sleep disturbance and agitation  Physical Exam: Vitals:   05/21/19 1308  BP: 122/84  Pulse: 99  Temp: 98.7 F (37.1 C)  TempSrc: Temporal  SpO2: 94%  Weight: 209 lb (94.8 kg)  Height: 5' 7.75" (1.721 m)    Body mass index is 32.01 kg/m.   Constitutional: NAD, calm,  comfortable Eyes: PERRL, lids and conjunctivae normal ENMT: Mucous membranes are moist. Tympanic membrane is pearly white, no erythema or bulging. Neck: normal, supple, no masses, no thyromegaly Respiratory: clear to auscultation bilaterally, no wheezing, no crackles. Normal respiratory effort. No accessory muscle use.  Cardiovascular: Regular rate and rhythm, no murmurs / rubs / gallops. No extremity edema. 2+ pedal pulses. No carotid bruits.  Abdomen: no tenderness, no masses palpated. No hepatosplenomegaly. Bowel sounds positive.  Musculoskeletal: no clubbing / cyanosis. No joint deformity upper and lower extremities. Good ROM, no contractures. Normal muscle tone.  Skin: no rashes, lesions, ulcers. No induration Neurologic: CN 2-12 grossly intact. Sensation intact, DTR normal. Strength 5/5 in all 4.  Psychiatric: Normal judgment and insight. Alert and oriented x 3. Normal mood.    Impression and Plan:  Encounter for preventive health examination  -Have advised routine eye and dental care. -He will receive his first shingles vaccine today, otherwise immunizations are up-to-date. -Screening labs today. -Healthy lifestyle has been discussed in detail. -Referral for colonoscopy has already been placed. -Will check PSA today for routine prostate cancer screening.  Obesity (BMI 30.0-34.9) -Discussed healthy lifestyle, including increased physical activity and better food choices to promote weight loss.  Tobacco abuse  -I have discussed tobacco cessation with the patient.  I have counseled the patient regarding the negative impacts of continued tobacco use including but not limited to lung cancer, COPD, and cardiovascular disease.  I have discussed alternatives to tobacco and modalities that may help facilitate tobacco cessation including but not limited to biofeedback, hypnosis, and medications.  Total time spent with tobacco counseling was 4 minutes. -After discussion he has agreed to start  Wellbutrin and to consider counseling sessions. -Follow-up with him in 3 months.  PUD (peptic ulcer disease) -Continue PPI therapy.  Cocaine use -He denies continued cocaine use, however review of chart as stated above notes syncopal event in September was thought secondary to cocaine use.    Patient Instructions  -Nice seeing you today!!  -Lab work today; will notify you once results are available.  -Start Wellbutrin 150 mg daily for smoking cessation. Also make sure you schedule appointment for counseling sessions. [name] is available in our office.  -First shingles vaccines. Second when you return in 3 months.  -Schedule follow up in 3 months.   Preventive Care 67-67 Years Old, Male Preventive care refers to lifestyle choices and visits with your health care provider that can promote health and wellness. This includes:  A yearly physical exam. This is also called an annual well check.  Regular dental and eye exams.  Immunizations.  Screening for certain conditions.  Healthy lifestyle choices, such as eating a healthy diet, getting regular exercise, not using drugs or products that contain nicotine and tobacco, and limiting alcohol use. What can I expect for my preventive care visit? Physical exam Your health care provider will check:  Height and weight. These may be used to calculate body mass index (BMI), which is a measurement that tells if you are at a healthy weight.  Heart rate and blood pressure.  Your skin for abnormal spots. Counseling Your health care provider may ask you questions about:  Alcohol,  tobacco, and drug use.  Emotional well-being.  Home and relationship well-being.  Sexual activity.  Eating habits.  Work and work Statistician. What immunizations do I need?  Influenza (flu) vaccine  This is recommended every year. Tetanus, diphtheria, and pertussis (Tdap) vaccine  You may need a Td booster every 10 years. Varicella  (chickenpox) vaccine  You may need this vaccine if you have not already been vaccinated. Zoster (shingles) vaccine  You may need this after age 31. Measles, mumps, and rubella (MMR) vaccine  You may need at least one dose of MMR if you were born in 1957 or later. You may also need a second dose. Pneumococcal conjugate (PCV13) vaccine  You may need this if you have certain conditions and were not previously vaccinated. Pneumococcal polysaccharide (PPSV23) vaccine  You may need one or two doses if you smoke cigarettes or if you have certain conditions. Meningococcal conjugate (MenACWY) vaccine  You may need this if you have certain conditions. Hepatitis A vaccine  You may need this if you have certain conditions or if you travel or work in places where you may be exposed to hepatitis A. Hepatitis B vaccine  You may need this if you have certain conditions or if you travel or work in places where you may be exposed to hepatitis B. Haemophilus influenzae type b (Hib) vaccine  You may need this if you have certain risk factors. Human papillomavirus (HPV) vaccine  If recommended by your health care provider, you may need three doses over 6 months. You may receive vaccines as individual doses or as more than one vaccine together in one shot (combination vaccines). Talk with your health care provider about the risks and benefits of combination vaccines. What tests do I need? Blood tests  Lipid and cholesterol levels. These may be checked every 5 years, or more frequently if you are over 69 years old.  Hepatitis C test.  Hepatitis B test. Screening  Lung cancer screening. You may have this screening every year starting at age 68 if you have a 30-pack-year history of smoking and currently smoke or have quit within the past 15 years.  Prostate cancer screening. Recommendations will vary depending on your family history and other risks.  Colorectal cancer screening. All adults should  have this screening starting at age 45 and continuing until age 48. Your health care provider may recommend screening at age 78 if you are at increased risk. You will have tests every 1-10 years, depending on your results and the type of screening test.  Diabetes screening. This is done by checking your blood sugar (glucose) after you have not eaten for a while (fasting). You may have this done every 1-3 years.  Sexually transmitted disease (STD) testing. Follow these instructions at home: Eating and drinking  Eat a diet that includes fresh fruits and vegetables, whole grains, lean protein, and low-fat dairy products.  Take vitamin and mineral supplements as recommended by your health care provider.  Do not drink alcohol if your health care provider tells you not to drink.  If you drink alcohol: ? Limit how much you have to 0-2 drinks a day. ? Be aware of how much alcohol is in your drink. In the [place] one drink equals one 12 oz bottle of beer (355 mL), one 5 oz glass of wine (148 mL), or one 1 oz glass of hard liquor (44 mL). Lifestyle  Take daily care of your teeth and gums.  Stay active. Exercise for at  least 30 minutes on 5 or more days each week.  Do not use any products that contain nicotine or tobacco, such as cigarettes, e-cigarettes, and chewing tobacco. If you need help quitting, ask your health care provider.  If you are sexually active, practice safe sex. Use a condom or other form of protection to prevent STIs (sexually transmitted infections).  Talk with your health care provider about taking a low-dose aspirin every day starting at age 14. What's next?  Go to your health care provider once a year for a well check visit.  Ask your health care provider how often you should have your eyes and teeth checked.  Stay up to date on all vaccines. This information is not intended to replace advice given to you by your health care provider. Make sure you discuss any questions  you have with your health care provider. Document Released: 07/01/2015 Document Revised: 05/29/2018 Document Reviewed: 05/29/2018 Elsevier Patient Education  2020 [place], [place] [facility]

## 2019-06-22 ENCOUNTER — Emergency Department (HOSPITAL_COMMUNITY)
Admission: EM | Admit: 2019-06-22 | Discharge: 2019-06-23 | Discharge status: Against Medical Advice | Payer: Managed Care, Other (non HMO) | Attending: Emergency Medicine | Admitting: Emergency Medicine

## 2019-06-22 ENCOUNTER — Other Ambulatory Visit: Payer: Self-pay

## 2019-06-22 ENCOUNTER — Encounter (HOSPITAL_COMMUNITY): Payer: Self-pay | Admitting: Emergency Medicine

## 2019-06-22 DIAGNOSIS — Z5321 Procedure and treatment not carried out due to patient leaving prior to being seen by health care provider: Secondary | ICD-10-CM | POA: Insufficient documentation

## 2019-06-22 LAB — COMPREHENSIVE METABOLIC PANEL
ALT: 18 U/L (ref 0–44)
AST: 23 U/L (ref 15–41)
Albumin: 3.4 g/dL — ABNORMAL LOW (ref 3.5–5.0)
Alkaline Phosphatase: 75 U/L (ref 38–126)
Anion gap: 13 (ref 5–15)
BUN: 13 mg/dL (ref 6–20)
CO2: 18 mmol/L — ABNORMAL LOW (ref 22–32)
Calcium: 9.1 mg/dL (ref 8.9–10.3)
Chloride: 106 mmol/L (ref 98–111)
Creatinine, Ser: 1.17 mg/dL (ref 0.61–1.24)
GFR calc Af Amer: >60 mL/min (ref >60)
GFR calc non Af Amer: >60 mL/min (ref >60)
Glucose, Bld: 124 mg/dL — ABNORMAL HIGH (ref 70–99)
Potassium: 4.6 mmol/L (ref 3.5–5.1)
Sodium: 137 mmol/L (ref 135–145)
Total Bilirubin: 0.9 mg/dL (ref 0.3–1.2)
Total Protein: 7.6 g/dL (ref 6.5–8.1)

## 2019-06-22 LAB — LIPASE, BLOOD: Lipase: 21 U/L (ref 11–51)

## 2019-06-22 LAB — CBC
HCT: 45.1 % (ref 39.0–52.0)
Hemoglobin: 15.2 g/dL (ref 13.0–17.0)
MCH: 30.6 pg (ref 26.0–34.0)
MCHC: 33.7 g/dL (ref 30.0–36.0)
MCV: 90.7 fL (ref 80.0–100.0)
Platelets: 297 10*3/uL (ref 150–400)
RBC: 4.97 MIL/uL (ref 4.22–5.81)
RDW: 12.3 % (ref 11.5–15.5)
WBC: 9.6 10*3/uL (ref 4.0–10.5)
nRBC: 0 % (ref 0.0–0.2)

## 2019-06-22 MED ORDER — SODIUM CHLORIDE 0.9% FLUSH: 3.0000 mL | Freq: Once | INTRAVENOUS | Status: DC

## 2019-06-22 NOTE — ED Triage Notes (Signed)
Pt c/o left sided abdominal pain that radiates to the back x 3 days. Pt states pain is worse when trying to lay flat. Denies nausea/vomiting/urinary symptoms.

## 2019-06-23 ENCOUNTER — Encounter (HOSPITAL_COMMUNITY): Payer: Self-pay | Admitting: *Deleted

## 2019-06-23 ENCOUNTER — Other Ambulatory Visit: Payer: Self-pay

## 2019-06-23 ENCOUNTER — Emergency Department (HOSPITAL_COMMUNITY)
Admission: EM | Admit: 2019-06-23 | Discharge: 2019-06-23 | Disposition: A | Discharge status: Home / Self Care | Payer: Self-pay | Attending: Emergency Medicine | Admitting: Emergency Medicine

## 2019-06-23 ENCOUNTER — Emergency Department (HOSPITAL_COMMUNITY): Payer: Self-pay

## 2019-06-23 DIAGNOSIS — Z79899 Other long term (current) drug therapy: Secondary | ICD-10-CM | POA: Insufficient documentation

## 2019-06-23 DIAGNOSIS — R109 Unspecified abdominal pain: Secondary | ICD-10-CM | POA: Insufficient documentation

## 2019-06-23 DIAGNOSIS — Z20822 Contact with and (suspected) exposure to covid-19: Secondary | ICD-10-CM | POA: Insufficient documentation

## 2019-06-23 DIAGNOSIS — F17220 Nicotine dependence, chewing tobacco, uncomplicated: Secondary | ICD-10-CM | POA: Insufficient documentation

## 2019-06-23 LAB — URINALYSIS, ROUTINE W REFLEX MICROSCOPIC
Bacteria, UA: NONE SEEN
Bacteria, UA: NONE SEEN
Bilirubin Urine: NEGATIVE
Bilirubin Urine: NEGATIVE
Glucose, UA: NEGATIVE mg/dL
Glucose, UA: NEGATIVE mg/dL
Hgb urine dipstick: NEGATIVE
Hgb urine dipstick: NEGATIVE
Ketones, ur: NEGATIVE mg/dL
Ketones, ur: NEGATIVE mg/dL
Nitrite: NEGATIVE
Nitrite: NEGATIVE
Protein, ur: NEGATIVE mg/dL
Protein, ur: NEGATIVE mg/dL
Specific Gravity, Urine: 1.02 (ref 1.005–1.030)
Specific Gravity, Urine: 1.02 (ref 1.005–1.030)
WBC, UA: >50 WBC/hpf — ABNORMAL HIGH (ref 0–5)
pH: 5 (ref 5.0–8.0)
pH: 5 (ref 5.0–8.0)

## 2019-06-23 MED ORDER — METHOCARBAMOL 500 MG PO TABS: 500.0000 mg | ORAL_TABLET | Freq: Three times a day (TID) | ORAL | 0 refills | Status: DC | PRN

## 2019-06-23 MED ORDER — IOHEXOL 300 MG/ML  SOLN
100.0000 mL | Freq: Once | INTRAMUSCULAR | Status: AC | PRN
  Administered 2019-06-23: 17:00:00 100 mL via INTRAVENOUS

## 2019-06-23 MED ORDER — IBUPROFEN 400 MG PO TABS: 400.0000 mg | ORAL_TABLET | Freq: Four times a day (QID) | ORAL | 0 refills | Status: DC | PRN

## 2019-06-23 MED ORDER — KETOROLAC TROMETHAMINE 30 MG/ML IJ SOLN
15.0000 mg | Freq: Once | INTRAMUSCULAR | Status: AC
  Administered 2019-06-23: 16:00:00 15 mg via INTRAVENOUS
  Filled 2019-06-23: qty 1

## 2019-06-23 MED ORDER — SODIUM CHLORIDE (PF) 0.9 % IJ SOLN
INTRAMUSCULAR | Status: AC
  Filled 2019-06-23: qty 50

## 2019-06-23 NOTE — ED Provider Notes (Signed)
[facility]-EMERGENCY DEPT Provider Note   CSN: [id number] Arrival date & time: 06/23/19  6301     History Chief Complaint  Patient presents with  . Flank Pain    left  . Abdominal Pain    Christian Howard is a 59 y.o. male.  HPI Patient presents with left-sided back to abdominal pain.  Began somewhat acutely try to get up the other day.  Worse with certain positions and turning.  Is better sitting still but not resolved.  No nausea vomiting diarrhea.  No fevers or chills.  No dysuria.  Went to [facility] yesterday but left without being seen due to the wait.  Has not had pains like this before.  No dysuria.  Reviewed blood work from yesterday.  No white count.  Urine did however have white cells.    Past Medical History:  Diagnosis Date  . Blood transfusion without reported diagnosis    states he had to have transfusion d/t gastric ulcer  . Gastric ulcer with hemorrhage     Patient Active Problem List   Diagnosis Date Noted  . Cocaine use 05/21/2019  . PUD (peptic ulcer disease) 02/19/2019  . Pain in right shoulder 02/19/2019  . Fatigue 02/19/2019  . Obesity (BMI 30.0-34.9) 02/19/2019  . Tobacco abuse 02/19/2019    History reviewed. No pertinent surgical history.     Family History  Problem Relation Age of Onset  . Cancer Sister     Social History   Tobacco Use  . Smoking status: Current Every Day Smoker    Packs/day: 1.00    Years: 43.00    Pack years: 43.00    Types: Cigarettes  . Smokeless tobacco: Current User  Substance Use Topics  . Alcohol use: Yes    Comment: occasional  . Drug use: No    Home Medications Prior to Admission medications   Medication Sig Start Date End Date Taking? Authorizing Provider  Acetaminophen (TYLENOL PO) Take 1 tablet by mouth every 6 (six) hours as needed (pain).   Yes [provider]  buPROPion (WELLBUTRIN XL) 150 MG 24 hr tablet Take 1 tablet (150 mg total) by mouth daily. 05/21/19    Philip Aspen, Limmie Patricia, MD  ibuprofen (ADVIL) 400 MG tablet Take 1 tablet (400 mg total) by mouth every 6 (six) hours as needed. 06/23/19   Benjiman Core, MD  methocarbamol (ROBAXIN) 500 MG tablet Take 1 tablet (500 mg total) by mouth every 8 (eight) hours as needed for muscle spasms. 06/23/19   Benjiman Core, MD  pantoprazole (PROTONIX) 40 MG tablet Take 1 tablet (40 mg total) by mouth daily. 02/19/19   Philip Aspen, Limmie Patricia, MD    Allergies    Patient has no known allergies.  Review of Systems   Review of Systems  Constitutional: Negative for appetite change.  HENT: Negative for congestion.   Respiratory: Negative for shortness of breath.   Cardiovascular: Negative for chest pain.  Gastrointestinal: Positive for abdominal pain.  Genitourinary: Positive for flank pain.  Musculoskeletal: Positive for back pain.  Neurological: Negative for weakness.  Psychiatric/Behavioral: Negative for confusion.    Physical Exam Updated Vital Signs BP 131/75   Pulse 77   Temp 98.4 F (36.9 C) (Oral)   Resp 19   Ht 5' 8.5" (1.74 m)   Wt 99.8 kg   SpO2 96%   BMI 32.96 kg/m   Physical Exam Vitals and nursing note reviewed.  Constitutional:      Appearance:  He is well-developed.  HENT:     Head: Atraumatic.  Eyes:     Pupils: Pupils are equal, round, and reactive to light.  Cardiovascular:     Rate and Rhythm: Normal rate and regular rhythm.  Abdominal:     Comments: Minimal left lower abdominal pain without rebound or guarding.  No CVA tenderness.  Genitourinary:    Comments: No hernia palpated. Skin:    General: Skin is warm.     Capillary Refill: Capillary refill takes less than 2 seconds.  Neurological:     Mental Status: He is alert and oriented to person, place, and time.     ED Results / Procedures / Treatments   Labs (all labs ordered are listed, but only abnormal results are displayed) Labs Reviewed  URINALYSIS, ROUTINE W REFLEX MICROSCOPIC - Abnormal;  Notable for the following components:      Result Value   Leukocytes,Ua TRACE (*)    All other components within normal limits  URINE CULTURE  NOVEL CORONAVIRUS, NAA (HOSP ORDER, SEND-OUT TO REF LAB; TAT 18-24 HRS)    EKG None  Radiology CT ABDOMEN PELVIS W CONTRAST  Result Date: 06/23/2019 CLINICAL DATA:  Left lower quadrant pain and left flank pain. EXAM: CT ABDOMEN AND PELVIS WITH CONTRAST TECHNIQUE: Multidetector CT imaging of the abdomen and pelvis was performed using the standard protocol following bolus administration of intravenous contrast. CONTRAST:  191mL OMNIPAQUE IOHEXOL 300 MG/ML  SOLN COMPARISON:  None. FINDINGS: Lower Chest: No acute findings. Hepatobiliary: No hepatic masses identified. Gallbladder is unremarkable. No evidence of biliary ductal dilatation. Pancreas:  No mass or inflammatory changes. Spleen: Within normal limits in size and appearance. Adrenals/Urinary Tract: No masses identified. No evidence of ureteral calculi or hydronephrosis. Stomach/Bowel: No evidence of obstruction, inflammatory process or abnormal fluid collections. No evidence of diverticular disease. Normal appendix visualized. Vascular/Lymphatic: No pathologically enlarged lymph nodes. No abdominal aortic aneurysm. Aortic atherosclerosis incidentally noted. Reproductive:  No mass or other significant abnormality. Other:  None. Musculoskeletal:  No suspicious bone lesions identified. IMPRESSION: No acute findings or other significant abnormality identified. Aortic Atherosclerosis (ICD10-I70.0). Electronically Signed   By: Marlaine Hind M.D.   On: 06/23/2019 17:44    Procedures Procedures (including critical care time)  Medications Ordered in ED Medications  ketorolac (TORADOL) 30 MG/ML injection 15 mg (15 mg Intravenous Given 06/23/19 1545)  iohexol (OMNIPAQUE) 300 MG/ML solution 100 mL (100 mLs Intravenous Contrast Given 06/23/19 1711)  sodium chloride (PF) 0.9 % injection (  Given by Other 06/23/19 1721)      ED Course  I have reviewed the triage vital signs and the nursing notes.  Pertinent labs & imaging results that were available during my care of the patient were reviewed by me and considered in my medical decision making (see chart for details).    MDM Rules/Calculators/A&P                     Patient with left flank pain.  Urine shows some white cells without clear infection.  Culture sent.  Lab work otherwise reassuring.  CT scan done also reassuring.  Does have occasional cough lungs clear on examination.  Low lung fields on CT scan fine to.  Covid test was sent however.  Discharge home.  Will treat symptomatically. Final Clinical Impression(s) / ED Diagnoses Final diagnoses:  Flank pain    Rx / DC Orders ED Discharge Orders         Ordered  methocarbamol (ROBAXIN) 500 MG tablet  Every 8 hours PRN     06/23/19 1911    ibuprofen (ADVIL) 400 MG tablet  Every 6 hours PRN     06/23/19 1911           Benjiman Core, MD 06/23/19 1913

## 2019-06-23 NOTE — ED Notes (Signed)
Pt leaving AMA. Pt stated he is going somewhere else to be seen.

## 2019-06-23 NOTE — ED Triage Notes (Signed)
Went tot [facility] yesterday for left flank pain, was not seen, No urinary, NV/D noted, states it is worse trying to get OOB

## 2019-06-23 NOTE — Discharge Instructions (Addendum)
The urine culture has been sent.  You will be notified if it shows infection.  Follow-up with your doctor as needed

## 2019-06-24 LAB — URINE CULTURE: Culture: NO GROWTH

## 2019-06-27 LAB — NOVEL CORONAVIRUS, NAA (HOSP ORDER, SEND-OUT TO REF LAB; TAT 18-24 HRS): SARS-CoV-2, NAA: NOT DETECTED

## 2019-08-14 ENCOUNTER — Other Ambulatory Visit: Payer: Self-pay

## 2019-08-14 ENCOUNTER — Encounter: Payer: Self-pay | Admitting: Internal Medicine

## 2019-08-17 ENCOUNTER — Ambulatory Visit: Payer: BC Managed Care – PPO | Admitting: Family Medicine

## 2019-08-21 ENCOUNTER — Ambulatory Visit: Payer: Managed Care, Other (non HMO) | Admitting: Internal Medicine

## 2019-09-03 ENCOUNTER — Other Ambulatory Visit: Payer: Self-pay

## 2019-09-04 ENCOUNTER — Ambulatory Visit (INDEPENDENT_AMBULATORY_CARE_PROVIDER_SITE_OTHER): Payer: BC Managed Care – PPO | Admitting: Internal Medicine

## 2019-09-04 VITALS — BP 110/80 | HR 88 | Temp 97.4°F | Wt 210.9 lb

## 2019-09-04 DIAGNOSIS — L2082 Flexural eczema: Secondary | ICD-10-CM

## 2019-09-04 DIAGNOSIS — Z23 Encounter for immunization: Secondary | ICD-10-CM

## 2019-09-04 MED ORDER — TRIAMCINOLONE ACETONIDE 0.1 % EX CREA: 1.0000 "application " | TOPICAL_CREAM | Freq: Two times a day (BID) | CUTANEOUS | 0 refills | Status: DC

## 2019-09-04 NOTE — Addendum Note (Signed)
Addended by: Kern Reap B on: 09/04/2019 04:10 PM   Modules accepted: Orders

## 2019-09-04 NOTE — Patient Instructions (Signed)
-  Nice seeing you today!!  -apply triamcinolone cream twice daily to clean, dry skin.  -Lotion (CeraVe) to whole body after shower.  -Happy early birthday!

## 2019-09-04 NOTE — Progress Notes (Signed)
Acute office Visit     This visit occurred during the SARS-CoV-2 public health emergency.  Safety protocols were in place, including screening questions prior to the visit, additional usage of staff PPE, and extensive cleaning of exam room while observing appropriate contact time as indicated for disinfecting solutions.    CC/Reason for Visit: Rash on arms  HPI: Christian Howard is a 59 y.o. male who is coming in today for the above mentioned reasons.  He has noticed for about the past 2 weeks a scaly rash on the flexor surfaces of his elbows on both arms.  It is not itchy, he also has some patches around his neck and behind his knee.  He is also due for his second shingles vaccination today.  Past Medical/Surgical History: Past Medical History:  Diagnosis Date  . Blood transfusion without reported diagnosis    states he had to have transfusion d/t gastric ulcer  . Gastric ulcer with hemorrhage     No past surgical history on file.  Social History:  reports that he has been smoking cigarettes. He has a 43.00 pack-year smoking history. He uses smokeless tobacco. He reports current alcohol use. He reports that he does not use drugs.  Allergies: No Known Allergies  Family History:  Family History  Problem Relation Age of Onset  . Cancer Sister      Current Outpatient Medications:  .  Acetaminophen (TYLENOL PO), Take 1 tablet by mouth every 6 (six) hours as needed (pain)., Disp: , Rfl:  .  buPROPion (WELLBUTRIN XL) 150 MG 24 hr tablet, Take 1 tablet (150 mg total) by mouth daily., Disp: 90 tablet, Rfl: 1 .  ibuprofen (ADVIL) 400 MG tablet, Take 1 tablet (400 mg total) by mouth every 6 (six) hours as needed., Disp: 10 tablet, Rfl: 0 .  methocarbamol (ROBAXIN) 500 MG tablet, Take 1 tablet (500 mg total) by mouth every 8 (eight) hours as needed for muscle spasms., Disp: 8 tablet, Rfl: 0 .  pantoprazole (PROTONIX) 40 MG tablet, Take 1 tablet (40 mg total) by mouth daily., Disp:  90 tablet, Rfl: 1 .  triamcinolone cream (KENALOG) 0.1 %, Apply 1 application topically 2 (two) times daily., Disp: 30 g, Rfl: 0  Review of Systems:  Constitutional: Denies fever, chills, diaphoresis, appetite change and fatigue.  HEENT: Denies photophobia, eye pain, redness, hearing loss, ear pain, congestion, sore throat, rhinorrhea, sneezing, mouth sores, trouble swallowing, neck pain, neck stiffness and tinnitus.   Respiratory: Denies SOB, DOE, cough, chest tightness,  and wheezing.   Cardiovascular: Denies chest pain, palpitations and leg swelling.  Gastrointestinal: Denies nausea, vomiting, abdominal pain, diarrhea, constipation, blood in stool and abdominal distention.  Genitourinary: Denies dysuria, urgency, frequency, hematuria, flank pain and difficulty urinating.  Endocrine: Denies: hot or cold intolerance, sweats, changes in hair or nails, polyuria, polydipsia. Musculoskeletal: Denies myalgias, back pain, joint swelling, arthralgias and gait problem.  Skin: Denies pallor, rash and wound.  Neurological: Denies dizziness, seizures, syncope, weakness, light-headedness, numbness and headaches.  Hematological: Denies adenopathy. Easy bruising, personal or family bleeding history  Psychiatric/Behavioral: Denies suicidal ideation, mood changes, confusion, nervousness, sleep disturbance and agitation    Physical Exam: Vitals:   09/04/19 0900  BP: 110/80  Pulse: 88  Temp: (!) 97.4 F (36.3 C)  TempSrc: Temporal  SpO2: 96%  Weight: 210 lb 14.4 oz (95.7 kg)    Body mass index is 31.6 kg/m.   Constitutional: NAD, calm, comfortable Eyes: PERRL, lids and conjunctivae normal Skin:  Large, dry, scaly, hyperpigmented areas around the flexor surfaces of the elbows of both arms.  Psychiatric: Normal judgment and insight. Alert and oriented x 3. Normal mood.    Impression and Plan:  Flexural eczema  -Triamcinolone cream to involved areas twice daily for a week. -Have advised  moisturizing lotion to clean dry skin after shower daily.    Patient Instructions  -Nice seeing you today!!  -apply triamcinolone cream twice daily to clean, dry skin.  -Lotion (CeraVe) to whole body after shower.  -Happy early birthday!       Chaya Jan, MD [facility]

## 2019-10-15 ENCOUNTER — Ambulatory Visit: Payer: Medicaid Other | Admitting: Internal Medicine

## 2019-11-13 ENCOUNTER — Encounter: Payer: Self-pay | Admitting: Internal Medicine

## 2019-11-13 DIAGNOSIS — L2082 Flexural eczema: Secondary | ICD-10-CM

## 2020-02-11 ENCOUNTER — Encounter: Payer: Self-pay | Admitting: Internal Medicine

## 2020-02-25 ENCOUNTER — Ambulatory Visit: Payer: BC Managed Care – PPO | Admitting: Internal Medicine

## 2020-02-26 ENCOUNTER — Ambulatory Visit: Payer: BC Managed Care – PPO | Admitting: Physician Assistant

## 2020-03-17 ENCOUNTER — Encounter: Payer: Self-pay | Admitting: Internal Medicine

## 2020-03-17 DIAGNOSIS — K279 Peptic ulcer, site unspecified, unspecified as acute or chronic, without hemorrhage or perforation: Secondary | ICD-10-CM

## 2020-03-18 MED ORDER — PANTOPRAZOLE SODIUM 40 MG PO TBEC: 40.0000 mg | DELAYED_RELEASE_TABLET | Freq: Every day | ORAL | 1 refills | Status: DC

## 2020-05-17 ENCOUNTER — Encounter: Payer: Self-pay | Admitting: Internal Medicine

## 2020-05-20 ENCOUNTER — Other Ambulatory Visit: Payer: Self-pay

## 2020-05-20 ENCOUNTER — Ambulatory Visit (INDEPENDENT_AMBULATORY_CARE_PROVIDER_SITE_OTHER): Payer: BC Managed Care – PPO | Admitting: Family Medicine

## 2020-05-20 ENCOUNTER — Encounter: Payer: Self-pay | Admitting: Family Medicine

## 2020-05-20 VITALS — BP 140/80 | HR 91 | Temp 98.7°F | Resp 16 | Ht 68.5 in | Wt 199.4 lb

## 2020-05-20 DIAGNOSIS — L723 Sebaceous cyst: Secondary | ICD-10-CM

## 2020-05-20 DIAGNOSIS — L03312 Cellulitis of back [any part except buttock]: Secondary | ICD-10-CM

## 2020-05-20 MED ORDER — DOXYCYCLINE HYCLATE 100 MG PO TABS: 100.0000 mg | ORAL_TABLET | Freq: Two times a day (BID) | ORAL | 0 refills | Status: AC

## 2020-05-20 NOTE — Patient Instructions (Signed)
A few things to remember from today's visit:  Cellulitis of lower back - Plan: doxycycline (VIBRA-TABS) 100 MG tablet  Sebaceous cyst - Plan: Ambulatory referral to General Surgery  Take antibiotic with food. Warm compresses for a few min and a few times per day. Appointment with surgeon will be arranged.  Please be sure medication list is accurate. If a new problem present, please set up appointment sooner than planned today.

## 2020-05-20 NOTE — Progress Notes (Signed)
Chief Complaint  Patient presents with  . boil on back   HPI: [name] is a 59 y.o. male with history of tobacco use disorder, chronic shoulder pain, and PUD here today complaining of lesion on lower back. States that he has had lesion for a while.  He has not noted pain but his wife noted a "little bump" on top. Problem is constant.  It was drained before but "came back." He denies fever, chills, change in appetite, lower back pain, abdominal pain, nausea, vomiting, or urinary symptoms.  He has not tried OTC medications.  Review of Systems  Respiratory: Negative for cough, shortness of breath and stridor.   Gastrointestinal: Negative for abdominal pain, nausea and vomiting.  Musculoskeletal: Positive for arthralgias. Negative for gait problem.  Rest see pertinent positives and negatives per HPI.  Current Outpatient Medications on File Prior to Visit  Medication Sig Dispense Refill  . Acetaminophen (TYLENOL PO) Take 1 tablet by mouth every 6 (six) hours as needed (pain).    Marland Kitchen buPROPion (WELLBUTRIN XL) 150 MG 24 hr tablet Take 1 tablet (150 mg total) by mouth daily. 90 tablet 1  . ibuprofen (ADVIL) 400 MG tablet Take 1 tablet (400 mg total) by mouth every 6 (six) hours as needed. 10 tablet 0  . methocarbamol (ROBAXIN) 500 MG tablet Take 1 tablet (500 mg total) by mouth every 8 (eight) hours as needed for muscle spasms. 8 tablet 0  . pantoprazole (PROTONIX) 40 MG tablet Take 1 tablet (40 mg total) by mouth daily. 90 tablet 1  . triamcinolone cream (KENALOG) 0.1 % Apply 1 application topically 2 (two) times daily. 30 g 0   No current facility-administered medications on file prior to visit.   Past Medical History:  Diagnosis Date  . Blood transfusion without reported diagnosis    states he had to have transfusion d/t gastric ulcer  . Gastric ulcer with hemorrhage    No Known Allergies  Social History   Socioeconomic History  . Marital status: Single    Spouse  name: Not on file  . Number of children: Not on file  . Years of education: Not on file  . Highest education level: Not on file  Occupational History  . Not on file  Tobacco Use  . Smoking status: Current Every Day Smoker    Packs/day: 1.00    Years: 43.00    Pack years: 43.00    Types: Cigarettes  . Smokeless tobacco: Current User  Vaping Use  . Vaping Use: Never used  Substance and Sexual Activity  . Alcohol use: Yes    Comment: occasional  . Drug use: No  . Sexual activity: Yes  Other Topics Concern  . Not on file  Social History Narrative  . Not on file   Social Determinants of Health   Financial Resource Strain:   . Difficulty of Paying Living Expenses: Not on file  Food Insecurity:   . Worried About Programme researcher, broadcasting/film/video in the Last Year: Not on file  . Ran Out of Food in the Last Year: Not on file  Transportation Needs:   . Lack of Transportation (Medical): Not on file  . Lack of Transportation (Non-Medical): Not on file  Physical Activity:   . Days of Exercise per Week: Not on file  . Minutes of Exercise per Session: Not on file  Stress:   . Feeling of Stress : Not on file  Social Connections:   . Frequency of Communication  with Friends and Family: Not on file  . Frequency of Social Gatherings with Friends and Family: Not on file  . Attends Religious Services: Not on file  . Active Member of Clubs or Organizations: Not on file  . Attends Banker Meetings: Not on file  . Marital Status: Not on file    Vitals:   05/20/20 1515  BP: 140/80  Pulse: 91  Resp: 16  Temp: 98.7 F (37.1 C)  SpO2: 95%   Body mass index is 29.88 kg/m.  Physical Exam Vitals and nursing note reviewed.  Constitutional:      General: He is not in acute distress.    Appearance: He is well-developed.  HENT:     Head: Normocephalic and atraumatic.  Eyes:     Conjunctiva/sclera: Conjunctivae normal.  Neck:     Trachea: No tracheal deviation.  Pulmonary:      Effort: Pulmonary effort is normal. No respiratory distress.     Breath sounds: Normal breath sounds.  Musculoskeletal:       Back:     Comments: 4 cm nodular lesion with   Skin:    General: Skin is warm.     Comments: Lesion is firm with a 1 cm fluctuant area. Local heat and mild erythema. Black dimples x 2 on lesion.  Neurological:     General: No focal deficit present.     Mental Status: He is alert.     Gait: Gait normal.  Psychiatric:     Comments: Well groomed, good eye contact.    ASSESSMENT AND PLAN:  [name] was seen today for boil on back.  Diagnoses and all orders for this visit:  Cellulitis of lower back Small fluctuant area .After verbal consent and discussion of risk he agrees with trying aspiration. Local anesthesia with plain Lidocaine 1% after cleaning area with alcohol. Negative for purulent material. Instructed to take abx with food. Instructed about warning signs.  -     doxycycline (VIBRA-TABS) 100 MG tablet; Take 1 tablet (100 mg total) by mouth 2 (two) times daily for 7 days.  Sebaceous cyst Educated about Dx and prognosis. He has had recurrent episodes of infection. He would like lesion removed, appt with surgeon will be arranged.  -     Ambulatory referral to General Surgery   Return if symptoms worsen or fail to improve.   Elenora Hawbaker G. [place], MD  [facility]. [place] office.  A few things to remember from today's visit:  Cellulitis of lower back - Plan: doxycycline (VIBRA-TABS) 100 MG tablet  Sebaceous cyst - Plan: Ambulatory referral to General Surgery  Take antibiotic with food. Warm compresses for a few min and a few times per day. Appointment with surgeon will be arranged.  Please be sure medication list is accurate. If a new problem present, please set up appointment sooner than planned today.

## 2020-06-23 ENCOUNTER — Telehealth: Payer: Self-pay | Admitting: Internal Medicine

## 2020-06-23 NOTE — Telephone Encounter (Signed)
noted 

## 2020-06-23 NOTE — Telephone Encounter (Signed)
Pt is calling in stating that he received a text msg stating that he needs to get in contact with Dr. Ardyth Harps office about a boil on his back and he stated that he has been seen by Dr. [place] and has an appointment with someone at [facility].  He stated that he will keep that appointment with them.  And if someone from our office called him he will have his phone available.

## 2020-12-01 ENCOUNTER — Other Ambulatory Visit: Payer: Self-pay

## 2020-12-02 ENCOUNTER — Ambulatory Visit: Payer: BC Managed Care – PPO | Admitting: Internal Medicine

## 2020-12-03 ENCOUNTER — Encounter (HOSPITAL_COMMUNITY): Payer: Self-pay

## 2020-12-03 ENCOUNTER — Emergency Department (HOSPITAL_COMMUNITY)
Admission: EM | Admit: 2020-12-03 | Discharge: 2020-12-03 | Disposition: A | Discharge status: Home / Self Care | Payer: BC Managed Care – PPO | Attending: Emergency Medicine | Admitting: Emergency Medicine

## 2020-12-03 ENCOUNTER — Other Ambulatory Visit: Payer: Self-pay

## 2020-12-03 ENCOUNTER — Emergency Department (HOSPITAL_COMMUNITY): Payer: BC Managed Care – PPO

## 2020-12-03 DIAGNOSIS — F17210 Nicotine dependence, cigarettes, uncomplicated: Secondary | ICD-10-CM | POA: Diagnosis not present

## 2020-12-03 DIAGNOSIS — X500XXA Overexertion from strenuous movement or load, initial encounter: Secondary | ICD-10-CM | POA: Diagnosis not present

## 2020-12-03 DIAGNOSIS — S6991XA Unspecified injury of right wrist, hand and finger(s), initial encounter: Secondary | ICD-10-CM | POA: Diagnosis present

## 2020-12-03 DIAGNOSIS — Y990 Civilian activity done for income or pay: Secondary | ICD-10-CM | POA: Insufficient documentation

## 2020-12-03 MED ORDER — IBUPROFEN 400 MG PO TABS: 400.0000 mg | ORAL_TABLET | Freq: Four times a day (QID) | ORAL | 0 refills | Status: DC | PRN

## 2020-12-03 MED ORDER — METHYLPREDNISOLONE 4 MG PO TBPK: ORAL_TABLET | ORAL | 0 refills | Status: DC

## 2020-12-03 NOTE — ED Provider Notes (Signed)
[facility] EMERGENCY DEPARTMENT Provider Note   CSN: [id number] Arrival date & time: 12/03/20  1010     History No chief complaint on file.   Christian Howard is a 60 y.o. male.  The history is provided by the patient.  Hand Pain This is a new problem. The current episode started more than 1 week ago (3 weeks). The problem occurs constantly. The problem has not changed since onset.Pertinent negatives include no chest pain, no abdominal pain, no headaches and no shortness of breath. Exacerbated by: Works at [employer]. Hurts to grab boxes and pushing anything with the hand. Relieved by: None tried. He has tried nothing for the symptoms.   Pain in between the first and second finger webspace of the right hand for the past 3-4 weeks.  Does not note any particular injury however believes it is related to lifting and pushing boxes at work.  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  He is right-handed.     Past Medical History:  Diagnosis Date   Blood transfusion without reported diagnosis    states he had to have transfusion d/t gastric ulcer   Gastric ulcer with hemorrhage     Patient Active Problem List   Diagnosis Date Noted   Cocaine use 05/21/2019   PUD (peptic ulcer disease) 02/19/2019   Pain in right shoulder 02/19/2019   Fatigue 02/19/2019   Obesity (BMI 30.0-34.9) 02/19/2019   Tobacco abuse 02/19/2019    No past surgical history on file.     Family History  Problem Relation Age of Onset   Cancer Sister     Social History   Tobacco Use   Smoking status: Every Day    Packs/day: 1.00    Years: 43.00    Pack years: 43.00    Types: Cigarettes   Smokeless tobacco: Current  Vaping Use   Vaping Use: Never used  Substance Use Topics   Alcohol use: Yes    Comment: occasional   Drug use: No    Home Medications Prior to Admission medications   Medication Sig Start Date End Date Taking? Authorizing Provider   methylPREDNISolone (MEDROL DOSEPAK) 4 MG TBPK tablet Take as directed 12/03/20  Yes Ardeen Fillers, DO  Acetaminophen (TYLENOL PO) Take 1 tablet by mouth every 6 (six) hours as needed (pain).    [provider]  buPROPion (WELLBUTRIN XL) 150 MG 24 hr tablet Take 1 tablet (150 mg total) by mouth daily. 05/21/19   Philip Aspen, Limmie Patricia, MD  ibuprofen (ADVIL) 400 MG tablet Take 1 tablet (400 mg total) by mouth every 6 (six) hours as needed for moderate pain. 12/03/20   Ardeen Fillers, DO  methocarbamol (ROBAXIN) 500 MG tablet Take 1 tablet (500 mg total) by mouth every 8 (eight) hours as needed for muscle spasms. 06/23/19   Benjiman Core, MD  pantoprazole (PROTONIX) 40 MG tablet Take 1 tablet (40 mg total) by mouth daily. 03/18/20   Philip Aspen, Limmie Patricia, MD  triamcinolone cream (KENALOG) 0.1 % Apply 1 application topically 2 (two) times daily. 09/04/19   Philip Aspen, Limmie Patricia, MD    Allergies    Patient has no known allergies.  Review of Systems   Review of Systems  Constitutional:  Negative for chills and fever.  HENT:  Negative for ear pain and sore throat.   Eyes:  Negative for pain and visual disturbance.  Respiratory:  Negative for cough and shortness of breath.  Cardiovascular:  Negative for chest pain and palpitations.  Gastrointestinal:  Negative for abdominal pain and vomiting.  Genitourinary:  Negative for dysuria and hematuria.  Musculoskeletal:  Positive for arthralgias. Negative for back pain.  Skin:  Negative for color change and rash.  Neurological:  Negative for seizures, syncope and headaches.  All other systems reviewed and are negative.  Physical Exam Updated Vital Signs BP (!) 153/83 (BP Location: Left Arm)   Pulse 70   Temp 98.1 F (36.7 C) (Oral)   Resp 17   SpO2 100%   Physical Exam Vitals and nursing note reviewed.  Constitutional:      Appearance: He is well-developed.  HENT:     Head: Normocephalic and atraumatic.  Eyes:      Conjunctiva/sclera: Conjunctivae normal.  Cardiovascular:     Rate and Rhythm: Normal rate and regular rhythm.     Heart sounds: No murmur heard. Pulmonary:     Effort: Pulmonary effort is normal. No respiratory distress.     Breath sounds: Normal breath sounds.  Abdominal:     Palpations: Abdomen is soft.     Tenderness: There is no abdominal tenderness.  Musculoskeletal:     Cervical back: Neck supple.     Comments:  R hand: Thickened skin in the lateral aspect of the web space. There is some focal tenderness to palpation. No significant erythema. No warmth. Flexion at the MCP of the 1st digit is limited. DIP and PIP intact. No tenderness of the flexor tendon at the 1st digit. R wrist exam unremarkable.  Skin:    General: Skin is warm and dry.  Neurological:     Mental Status: He is alert.    ED Results / Procedures / Treatments   Labs (all labs ordered are listed, but only abnormal results are displayed) Labs Reviewed - No data to display  EKG None  Radiology DG Hand Complete Right  Result Date: 12/03/2020 CLINICAL DATA:  Right hand pain. EXAM: RIGHT HAND - COMPLETE 3+ VIEW COMPARISON:  None. FINDINGS: There is no evidence of fracture or dislocation. There is no evidence of arthropathy or other focal bone abnormality. Soft tissue thickening of the radial aspect of the palm. IMPRESSION: 1. No acute fracture or dislocation identified about the right hand. 2. Soft tissue thickening of the radial aspect of the palm. Electronically Signed   By: Ted Mcalpine M.D.   On: 12/03/2020 11:42    Procedures Procedures   Medications Ordered in ED Medications - No data to display  ED Course  I have reviewed the triage vital signs and the nursing notes.  Pertinent labs & imaging results that were available during my care of the patient were reviewed by me and considered in my medical decision making (see chart for details).    MDM Rules/Calculators/A&P                           Impression is musculoskeletal pain in the webspace of the right hand between the first and second digit.  No evidence of an infection.  I did consider flexor tenosynovitis however there is no evidence of that on exam.  No known significant trauma or penetrating injury.  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 Plain films without fracture or dislocation.  Will place on a Medrol Dosepak with Motrin as needed, RICE and he will schedule  appointment with his primary care doctor for follow-up.  Final Clinical Impression(s) / ED Diagnoses Final diagnoses:  Hand injury, right, initial encounter    Rx / DC Orders ED Discharge Orders          Ordered    methylPREDNISolone (MEDROL DOSEPAK) 4 MG TBPK tablet        12/03/20 1803    ibuprofen (ADVIL) 400 MG tablet  Every 6 hours PRN        12/03/20 1803             Ardeen Fillers, DO 12/03/20 1807    Terald Sleeper, MD 12/04/20 1022

## 2020-12-03 NOTE — ED Triage Notes (Signed)
Patient complains of right thumb pain, states he has pain with any ROM. Uses hand for work. Reports mild swelling to hand, unknown trauma

## 2020-12-03 NOTE — ED Provider Notes (Signed)
Emergency Medicine Provider Triage Evaluation Note  Derward Marple , a 60 y.o. male  was evaluated in triage.  Pt complains of gradual onset, constant, worsening, pain and swelling to R hand for the past 3 weeks. No trauma. 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 Denies any cuts to hand. His swelling and pain has gotten worse causing concern. No fevers or chills. .  Review of Systems  Positive: + r hand pain Negative: - erythema, edema, drainage  Physical Exam  BP (!) 146/89 (BP Location: Left Arm)   Pulse 68   Temp 98.3 F (36.8 C) (Oral)   Resp 16   SpO2 94%  Gen:   Awake, no distress   Resp:  Normal effort  MSK:   Moves extremities without difficulty  Other:  Swelling and TTP to thenar imminence of R hand along base of thumb with callus and TTP. 2+ radial pulse  Medical Decision Making  Medically screening exam initiated at 11:02 AM.  Appropriate orders placed.  Devondre Guzzetta was informed that the remainder of the evaluation will be completed by another provider, this initial triage assessment does not replace that evaluation, and the importance of remaining in the ED until their evaluation is complete.     Tanda Rockers, PA-C 12/03/20 1103    Koleen Distance, MD 12/04/20 [phone]

## 2020-12-03 NOTE — Discharge Instructions (Addendum)
  I have sent a medication to your pharmacy that should help decrease the inflammation and swelling in your hand.  It also likely start to improve the pain.  I recommend RICE therapy as outlined in this handout. You can also take Motrin (ibuprofen) as needed for pain.  I recommend limiting physical activity that would cause stress on the right hand for the next 3 days so that you can start healing.

## 2021-01-18 ENCOUNTER — Other Ambulatory Visit: Payer: Self-pay

## 2021-01-19 ENCOUNTER — Ambulatory Visit (INDEPENDENT_AMBULATORY_CARE_PROVIDER_SITE_OTHER): Payer: BC Managed Care – PPO | Admitting: Internal Medicine

## 2021-01-19 ENCOUNTER — Encounter: Payer: Self-pay | Admitting: Internal Medicine

## 2021-01-19 ENCOUNTER — Encounter: Payer: BC Managed Care – PPO | Admitting: Internal Medicine

## 2021-01-19 VITALS — BP 160/90 | HR 83 | Temp 98.4°F | Ht 68.0 in | Wt 189.8 lb

## 2021-01-19 DIAGNOSIS — Z1211 Encounter for screening for malignant neoplasm of colon: Secondary | ICD-10-CM

## 2021-01-19 DIAGNOSIS — F17210 Nicotine dependence, cigarettes, uncomplicated: Secondary | ICD-10-CM | POA: Diagnosis not present

## 2021-01-19 DIAGNOSIS — H539 Unspecified visual disturbance: Secondary | ICD-10-CM

## 2021-01-19 DIAGNOSIS — I10 Essential (primary) hypertension: Secondary | ICD-10-CM | POA: Diagnosis not present

## 2021-01-19 DIAGNOSIS — K279 Peptic ulcer, site unspecified, unspecified as acute or chronic, without hemorrhage or perforation: Secondary | ICD-10-CM

## 2021-01-19 DIAGNOSIS — Z0001 Encounter for general adult medical examination with abnormal findings: Secondary | ICD-10-CM

## 2021-01-19 DIAGNOSIS — Z720 Tobacco use: Secondary | ICD-10-CM

## 2021-01-19 DIAGNOSIS — Z0000 Encounter for general adult medical examination without abnormal findings: Secondary | ICD-10-CM

## 2021-01-19 LAB — CBC WITH DIFFERENTIAL/PLATELET
Basophils Absolute: 0 10*3/uL (ref 0.0–0.1)
Basophils Relative: 0.5 % (ref 0.0–3.0)
Eosinophils Absolute: 0.1 10*3/uL (ref 0.0–0.7)
Eosinophils Relative: 0.9 % (ref 0.0–5.0)
HCT: 47.6 % (ref 39.0–52.0)
Hemoglobin: 16.1 g/dL (ref 13.0–17.0)
Lymphocytes Relative: 26.3 % (ref 12.0–46.0)
Lymphs Abs: 2.4 10*3/uL (ref 0.7–4.0)
MCHC: 33.9 g/dL (ref 30.0–36.0)
MCV: 89.9 fl (ref 78.0–100.0)
Monocytes Absolute: 0.6 10*3/uL (ref 0.1–1.0)
Monocytes Relative: 6.9 % (ref 3.0–12.0)
Neutro Abs: 5.9 10*3/uL (ref 1.4–7.7)
Neutrophils Relative %: 65.4 % (ref 43.0–77.0)
Platelets: 243 10*3/uL (ref 150.0–400.0)
RBC: 5.29 Mil/uL (ref 4.22–5.81)
RDW: 13.8 % (ref 11.5–15.5)
WBC: 9 10*3/uL (ref 4.0–10.5)

## 2021-01-19 LAB — COMPREHENSIVE METABOLIC PANEL
ALT: 18 U/L (ref 0–53)
AST: 22 U/L (ref 0–37)
Albumin: 4.1 g/dL (ref 3.5–5.2)
Alkaline Phosphatase: 83 U/L (ref 39–117)
BUN: 12 mg/dL (ref 6–23)
CO2: 22 mEq/L (ref 19–32)
Calcium: 9.1 mg/dL (ref 8.4–10.5)
Chloride: 104 mEq/L (ref 96–112)
Creatinine, Ser: 0.99 mg/dL (ref 0.40–1.50)
GFR: 82.3 mL/min (ref >60.00)
Glucose, Bld: 85 mg/dL (ref 70–99)
Potassium: 4 mEq/L (ref 3.5–5.1)
Sodium: 136 mEq/L (ref 135–145)
Total Bilirubin: 1.1 mg/dL (ref 0.2–1.2)
Total Protein: 7.5 g/dL (ref 6.0–8.3)

## 2021-01-19 LAB — LIPID PANEL
Cholesterol: 190 mg/dL (ref 0–200)
HDL: 48.3 mg/dL (ref >39.00)
LDL Cholesterol: 123 mg/dL — ABNORMAL HIGH (ref 0–99)
NonHDL: 141.93
Total CHOL/HDL Ratio: 4
Triglycerides: 95 mg/dL (ref 0.0–149.0)
VLDL: 19 mg/dL (ref 0.0–40.0)

## 2021-01-19 LAB — VITAMIN D 25 HYDROXY (VIT D DEFICIENCY, FRACTURES): VITD: 17.67 ng/mL — ABNORMAL LOW (ref 30.00–100.00)

## 2021-01-19 LAB — TSH: TSH: 0.69 u[IU]/mL (ref 0.35–5.50)

## 2021-01-19 LAB — VITAMIN B12: Vitamin B-12: 203 pg/mL — ABNORMAL LOW (ref 211–911)

## 2021-01-19 LAB — HEMOGLOBIN A1C: Hgb A1c MFr Bld: 5.8 % (ref 4.6–6.5)

## 2021-01-19 MED ORDER — AMLODIPINE BESYLATE 5 MG PO TABS: 5.0000 mg | ORAL_TABLET | Freq: Every day | ORAL | 1 refills | Status: DC

## 2021-01-19 MED ORDER — PANTOPRAZOLE SODIUM 40 MG PO TBEC: 40.0000 mg | DELAYED_RELEASE_TABLET | Freq: Every day | ORAL | 1 refills | Status: DC

## 2021-01-19 MED ORDER — BUPROPION HCL ER (XL) 150 MG PO TB24: 150.0000 mg | ORAL_TABLET | Freq: Every day | ORAL | 1 refills | Status: DC

## 2021-01-19 NOTE — Patient Instructions (Signed)
-  Nice seeing you today!!  -Lab work today; will notify you once results are available.  -Start amlodipine 5 mg daily for your high blood pressure.  -Start wellbutrin (bupropion) 150 mg for smoking cessation.  -Make sure you schedule a dental exam.  -Referrals for your colonoscopy and eye exams will be placed today.  -Strongly recommend you schedule counseling sessions.  -Schedule follow up in 6 weeks.

## 2021-01-19 NOTE — Progress Notes (Signed)
Established Patient Office Visit     This visit occurred during the SARS-CoV-2 public health emergency.  Safety protocols were in place, including screening questions prior to the visit, additional usage of staff PPE, and extensive cleaning of exam room while observing appropriate contact time as indicated for disinfecting solutions.    CC/Reason for Visit: Annual preventive exam and follow-up chronic medical conditions  HPI: Christian Howard is a 60 y.o. male who is coming in today for the above mentioned reasons. Past Medical History is significant for: He has a history of peptic ulcer disease/GERD and tobacco abuse.  I have not really seen him for about 2 years.  He does not have routine eye or dental care.  He continues to smoke about a pack a day.  He believes he has had 1 COVID-vaccine.  His Tdap and shingles are up-to-date.  He has never had a colonoscopy.  He does not exercise routinely.  His blood pressure is quite elevated today.   Past Medical/Surgical History: Past Medical History:  Diagnosis Date   Blood transfusion without reported diagnosis    states he had to have transfusion d/t gastric ulcer   Gastric ulcer with hemorrhage     No past surgical history on file.  Social History:  reports that he has been smoking cigarettes. He has a 43.00 pack-year smoking history. He uses smokeless tobacco. He reports current alcohol use. He reports that he does not use drugs.  Allergies: No Known Allergies  Family History:  Family History  Problem Relation Age of Onset   Cancer Sister      Current Outpatient Medications:    amLODipine (NORVASC) 5 MG tablet, Take 1 tablet (5 mg total) by mouth daily., Disp: 90 tablet, Rfl: 1   ibuprofen (ADVIL) 400 MG tablet, Take 1 tablet (400 mg total) by mouth every 6 (six) hours as needed for moderate pain., Disp: 10 tablet, Rfl: 0   methocarbamol (ROBAXIN) 500 MG tablet, Take 1 tablet (500 mg total) by mouth every 8 (eight) hours as  needed for muscle spasms., Disp: 8 tablet, Rfl: 0   triamcinolone cream (KENALOG) 0.1 %, Apply 1 application topically 2 (two) times daily., Disp: 30 g, Rfl: 0   buPROPion (WELLBUTRIN XL) 150 MG 24 hr tablet, Take 1 tablet (150 mg total) by mouth daily., Disp: 90 tablet, Rfl: 1   pantoprazole (PROTONIX) 40 MG tablet, Take 1 tablet (40 mg total) by mouth daily., Disp: 90 tablet, Rfl: 1  Review of Systems:  Constitutional: Denies fever, chills, diaphoresis, appetite change and fatigue.  HEENT: Denies photophobia, eye pain, redness, hearing loss, ear pain, congestion, sore throat, rhinorrhea, sneezing, mouth sores, trouble swallowing, neck pain, neck stiffness and tinnitus.   Respiratory: Denies SOB, DOE, cough, chest tightness,  and wheezing.   Cardiovascular: Denies chest pain, palpitations and leg swelling.  Gastrointestinal: Denies nausea, vomiting, abdominal pain, diarrhea, constipation, blood in stool and abdominal distention.  Genitourinary: Denies dysuria, urgency, frequency, hematuria, flank pain and difficulty urinating.  Endocrine: Denies: hot or cold intolerance, sweats, changes in hair or nails, polyuria, polydipsia. Musculoskeletal: Denies myalgias, back pain, joint swelling, arthralgias and gait problem.  Skin: Denies pallor, rash and wound.  Neurological: Denies dizziness, seizures, syncope, weakness, light-headedness, numbness and headaches.  Hematological: Denies adenopathy. Easy bruising, personal or family bleeding history  Psychiatric/Behavioral: Denies suicidal ideation, mood changes, confusion, nervousness, sleep disturbance and agitation    Physical Exam: Vitals:   01/19/21 1110  BP: (!) 160/90  Pulse:  83  Temp: 98.4 F (36.9 C)  TempSrc: Oral  SpO2: 98%  Weight: 189 lb 12.8 oz (86.1 kg)  Height: 5\' 8"  (1.727 m)    Body mass index is 28.86 kg/m.   Constitutional: NAD, calm, comfortable Eyes: PERRL, lids and conjunctivae normal ENMT: Mucous membranes are  moist. Posterior pharynx clear of any exudate or lesions.  Poor dentition. Tympanic membrane is pearly white, no erythema or bulging. Neck: normal, supple, no masses, no thyromegaly Respiratory: clear to auscultation bilaterally, no wheezing, no crackles. Normal respiratory effort. No accessory muscle use.  Cardiovascular: Regular rate and rhythm, no murmurs / rubs / gallops. No extremity edema. 2+ pedal pulses. No carotid bruits.  Abdomen: no tenderness, no masses palpated. No hepatosplenomegaly. Bowel sounds positive.  Musculoskeletal: no clubbing / cyanosis. No joint deformity upper and lower extremities. Good ROM, no contractures. Normal muscle tone.  Skin: no rashes, lesions, ulcers. No induration Neurologic: CN 2-12 grossly intact. Sensation intact, DTR normal. Strength 5/5 in all 4.  Psychiatric: Normal judgment and insight. Alert and oriented x 3. Normal mood.    Impression and Plan:  Encounter for preventive health examination -Advised routine eye and dental care. -He is due for COVID vaccinations which he will obtain at his pharmacy. -Screening labs today. -Healthy lifestyle discussed in detail. -Refer to GI for initial screening colonoscopy.  PUD (peptic ulcer disease)  - Plan: pantoprazole (PROTONIX) 40 MG tablet  Screening for malignant neoplasm of colon  - Plan: Ambulatory referral to Gastroenterology  Cigarette nicotine dependence without complication  Primary hypertension  - Plan: amLODipine (NORVASC) 5 MG tablet, CBC with Differential/Platelet, Comprehensive metabolic panel, Hemoglobin A1c, Lipid panel, TSH, Vitamin B12, VITAMIN D 25 Hydroxy (Vit-D Deficiency, Fractures), VITAMIN D 25 Hydroxy (Vit-D Deficiency, Fractures), Vitamin B12, TSH, Lipid panel, Hemoglobin A1c, Comprehensive metabolic panel, CBC with Differential/Platelet -I will go ahead and make this diagnosis today, in office blood pressure is 160/90. -Start amlodipine 5 mg daily, he will return in 6 to 8  weeks for follow-up.  Tobacco abuse  -I have discussed tobacco cessation with the patient.  I have counseled the patient regarding the negative impacts of continued tobacco use including but not limited to lung cancer, COPD, and cardiovascular disease.  I have discussed alternatives to tobacco and modalities that may help facilitate tobacco cessation including but not limited to biofeedback, hypnosis, and medications.  Total time spent with tobacco counseling was 4 minutes. -He appears to be in the action phase of change.  Start Wellbutrin, have advised CBT sessions, he will return in 6 to 8 weeks for follow-up.  - Plan: buPROPion (WELLBUTRIN XL) 150 MG 24 hr tablet  Change in vision  - Plan: Ambulatory referral to Ophthalmology    Patient Instructions  -Nice seeing you today!!  -Lab work today; will notify you once results are available.  -Start amlodipine 5 mg daily for your high blood pressure.  -Start wellbutrin (bupropion) 150 mg for smoking cessation.  -Make sure you schedule a dental exam.  -Referrals for your colonoscopy and eye exams will be placed today.  -Strongly recommend you schedule counseling sessions.  -Schedule follow up in 6 weeks.     , MD [facility]

## 2021-01-20 ENCOUNTER — Other Ambulatory Visit: Payer: Self-pay | Admitting: Internal Medicine

## 2021-01-20 ENCOUNTER — Encounter: Payer: Self-pay | Admitting: Internal Medicine

## 2021-01-20 DIAGNOSIS — E785 Hyperlipidemia, unspecified: Secondary | ICD-10-CM | POA: Insufficient documentation

## 2021-01-20 DIAGNOSIS — E782 Mixed hyperlipidemia: Secondary | ICD-10-CM

## 2021-01-20 DIAGNOSIS — E559 Vitamin D deficiency, unspecified: Secondary | ICD-10-CM | POA: Insufficient documentation

## 2021-01-20 DIAGNOSIS — E538 Deficiency of other specified B group vitamins: Secondary | ICD-10-CM | POA: Insufficient documentation

## 2021-01-20 MED ORDER — ATORVASTATIN CALCIUM 20 MG PO TABS: 20.0000 mg | ORAL_TABLET | Freq: Every day | ORAL | 1 refills | Status: DC

## 2021-01-20 MED ORDER — VITAMIN D (ERGOCALCIFEROL) 1.25 MG (50000 UNIT) PO CAPS: 50000.0000 [IU] | ORAL_CAPSULE | ORAL | 0 refills | Status: AC

## 2021-01-25 ENCOUNTER — Other Ambulatory Visit: Payer: Self-pay

## 2021-01-26 ENCOUNTER — Ambulatory Visit (INDEPENDENT_AMBULATORY_CARE_PROVIDER_SITE_OTHER): Payer: BC Managed Care – PPO

## 2021-01-26 ENCOUNTER — Other Ambulatory Visit: Payer: Self-pay

## 2021-01-26 VITALS — BP 140/90 | HR 86

## 2021-01-26 DIAGNOSIS — E538 Deficiency of other specified B group vitamins: Secondary | ICD-10-CM

## 2021-01-26 MED ORDER — CYANOCOBALAMIN 1000 MCG/ML IJ SOLN
1000.0000 ug | Freq: Once | INTRAMUSCULAR | Status: AC
  Administered 2021-01-26: 1000 ug via INTRAMUSCULAR

## 2021-01-26 NOTE — Progress Notes (Signed)
Patient scheduled today for a B12 injection.  Patient is complaining of upper left chest pain with some new "deep breathing".  He has had these symptoms for 4 days.  He believes it started when he took his new prescription for hypertension.  Per Dr Ardyth Harps, patient should be seen in office at first available appointment.  If is symptoms do not improve, or worsen, he should go to the ED.  Patient verbally understood.  Patient has an appointment scheduled with Dr Caryl Never 01/27/21.

## 2021-01-26 NOTE — Progress Notes (Signed)
Per orders of Dr. Archie Endo, injection of Cyanocobalamin 1000 mcg given by Jalie Eiland L Mirakle Tomlin. Patient tolerated injection well.

## 2021-01-27 ENCOUNTER — Encounter: Payer: Self-pay | Admitting: Family Medicine

## 2021-01-27 ENCOUNTER — Ambulatory Visit (INDEPENDENT_AMBULATORY_CARE_PROVIDER_SITE_OTHER): Payer: BC Managed Care – PPO | Admitting: Family Medicine

## 2021-01-27 VITALS — BP 140/80 | HR 80 | Temp 97.9°F | Wt 194.3 lb

## 2021-01-27 DIAGNOSIS — I10 Essential (primary) hypertension: Secondary | ICD-10-CM | POA: Insufficient documentation

## 2021-01-27 DIAGNOSIS — R079 Chest pain, unspecified: Secondary | ICD-10-CM

## 2021-01-27 NOTE — Progress Notes (Signed)
Established Patient Office Visit  Subjective:  Patient ID: Christian Howard, male    DOB: Apr 07, 1961  Age: 60 y.o. MRN: [id number]  CC:  Chief Complaint  Patient presents with   Chest Pain    Started after starting BP meds. Pt states it feels like heart burn    HPI Christian Howard presents for chest pain symptoms.  Started about 5 days ago right after starting new blood pressure medication which is amlodipine.  No headaches.  No peripheral edema.  He states consistently about 2 hours after taking his medication he has some pain left substernal region.  No radiation, nausea, or diaphoresis. No exertional pain.  Somewhat of a burning quality.  Does have history of some GERD and not consistently taking Protonix.  He does smoke.  No heart history.  No recent exertional chest pain.  No nausea or diaphoresis.  Denies any illicit drug use.  Has used cocaine in the past but denies any current use  Past Medical History:  Diagnosis Date   Blood transfusion without reported diagnosis    states he had to have transfusion d/t gastric ulcer   Gastric ulcer with hemorrhage     No past surgical history on file.  Family History  Problem Relation Age of Onset   Cancer Sister     Social History   Socioeconomic History   Marital status: Single    Spouse name: Not on file   Number of children: Not on file   Years of education: Not on file   Highest education level: Not on file  Occupational History   Not on file  Tobacco Use   Smoking status: Every Day    Packs/day: 1.00    Years: 43.00    Pack years: 43.00    Types: Cigarettes   Smokeless tobacco: Current  Vaping Use   Vaping Use: Never used  Substance and Sexual Activity   Alcohol use: Yes    Comment: occasional   Drug use: No   Sexual activity: Yes  Other Topics Concern   Not on file  Social History Narrative   Not on file   Social Determinants of Health   Financial Resource Strain: Not on file  Food Insecurity: Not on file   Transportation Needs: Not on file  Physical Activity: Not on file  Stress: Not on file  Social Connections: Not on file  Intimate Partner Violence: Not on file    Outpatient Medications Prior to Visit  Medication Sig Dispense Refill   amLODipine (NORVASC) 5 MG tablet Take 1 tablet (5 mg total) by mouth daily. 90 tablet 1   atorvastatin (LIPITOR) 20 MG tablet Take 1 tablet (20 mg total) by mouth daily. 90 tablet 1   buPROPion (WELLBUTRIN XL) 150 MG 24 hr tablet Take 1 tablet (150 mg total) by mouth daily. 90 tablet 1   ibuprofen (ADVIL) 400 MG tablet Take 1 tablet (400 mg total) by mouth every 6 (six) hours as needed for moderate pain. 10 tablet 0   methocarbamol (ROBAXIN) 500 MG tablet Take 1 tablet (500 mg total) by mouth every 8 (eight) hours as needed for muscle spasms. 8 tablet 0   pantoprazole (PROTONIX) 40 MG tablet Take 1 tablet (40 mg total) by mouth daily. 90 tablet 1   triamcinolone cream (KENALOG) 0.1 % Apply 1 application topically 2 (two) times daily. 30 g 0   Vitamin D, Ergocalciferol, (DRISDOL) 1.25 MG (50000 UNIT) CAPS capsule Take 1 capsule (50,000 Units total) by mouth  every 7 (seven) days for 12 doses. 12 capsule 0   No facility-administered medications prior to visit.    No Known Allergies  ROS Review of Systems  Constitutional:  Negative for fatigue.  Eyes:  Negative for visual disturbance.  Respiratory:  Negative for cough.   Cardiovascular:  Positive for chest pain. Negative for palpitations and leg swelling.  Neurological:  Negative for dizziness, syncope, weakness, light-headedness and headaches.     Objective:    Physical Exam Constitutional:      Appearance: He is well-developed.  HENT:     Right Ear: External ear normal.     Left Ear: External ear normal.  Eyes:     Pupils: Pupils are equal, round, and reactive to light.  Neck:     Thyroid: No thyromegaly.  Cardiovascular:     Rate and Rhythm: Normal rate and regular rhythm.  Pulmonary:      Effort: Pulmonary effort is normal. No respiratory distress.     Breath sounds: Normal breath sounds. No wheezing or rales.  Musculoskeletal:     Cervical back: Neck supple.  Neurological:     Mental Status: He is alert and oriented to person, place, and time.    BP 140/80 (BP Location: Left Arm, Patient Position: Sitting, Cuff Size: Normal)   Pulse 80   Temp 97.9 F (36.6 C) (Oral)   Wt 194 lb 4.8 oz (88.1 kg)   SpO2 98%   BMI 29.54 kg/m  Wt Readings from Last 3 Encounters:  01/27/21 194 lb 4.8 oz (88.1 kg)  01/19/21 189 lb 12.8 oz (86.1 kg)  05/20/20 199 lb 6.4 oz (90.4 kg)     Health Maintenance Due  Topic Date Due   HIV Screening  Never done   Hepatitis C Screening  Never done   INFLUENZA VACCINE  01/16/2021    There are no preventive care reminders to display for this patient.  Lab Results  Component Value Date   TSH 0.69 01/19/2021   Lab Results  Component Value Date   WBC 9.0 01/19/2021   HGB 16.1 01/19/2021   HCT 47.6 01/19/2021   MCV 89.9 01/19/2021   PLT 243.0 01/19/2021   Lab Results  Component Value Date   NA 136 01/19/2021   K 4.0 01/19/2021   CO2 22 01/19/2021   GLUCOSE 85 01/19/2021   BUN 12 01/19/2021   CREATININE 0.99 01/19/2021   BILITOT 1.1 01/19/2021   ALKPHOS 83 01/19/2021   AST 22 01/19/2021   ALT 18 01/19/2021   PROT 7.5 01/19/2021   ALBUMIN 4.1 01/19/2021   CALCIUM 9.1 01/19/2021   ANIONGAP 13 06/22/2019   GFR 82.30 01/19/2021   Lab Results  Component Value Date   CHOL 190 01/19/2021   Lab Results  Component Value Date   HDL 48.30 01/19/2021   Lab Results  Component Value Date   LDLCALC 123 (H) 01/19/2021   Lab Results  Component Value Date   TRIG 95.0 01/19/2021   Lab Results  Component Value Date   CHOLHDL 4 01/19/2021   Lab Results  Component Value Date   HGBA1C 5.8 01/19/2021      Assessment & Plan:   Problem List Items Addressed This Visit   None Visit Diagnoses     Chest pain, unspecified type     -  Primary   Relevant Orders   EKG 12-Lead     Atypical chest pain which patient states started after starting amlodipine.  Explained this is not a  typical side effect from amlodipine.  -Check EKG. this shows sinus rhythm with no acute ST-T changes.  Occasional PVC.  His symptoms are atypical and occur only at night and not with exertion.  We did discuss possible alternative blood pressure medication but he would like to get this sometime first.  He does have some occasional reflux symptoms and currently not taking Protonix regularly.  We have advised him to get back on daily use of Protonix 40 mg daily to see if this alleviates his symptoms.  Follow-up immediately for any exertional chest symptoms or symptoms not relieved with Protonix  No orders of the defined types were placed in this encounter.   Follow-up: No follow-ups on file.    Evelena Peat, MD

## 2021-03-02 ENCOUNTER — Other Ambulatory Visit: Payer: Self-pay

## 2021-03-03 ENCOUNTER — Ambulatory Visit: Payer: BC Managed Care – PPO | Admitting: Internal Medicine

## 2021-09-08 DIAGNOSIS — Z630 Problems in relationship with spouse or partner: Secondary | ICD-10-CM | POA: Diagnosis not present

## 2021-09-08 DIAGNOSIS — F4320 Adjustment disorder, unspecified: Secondary | ICD-10-CM | POA: Diagnosis not present

## 2021-09-14 ENCOUNTER — Encounter: Payer: Self-pay | Admitting: Internal Medicine

## 2021-09-18 ENCOUNTER — Ambulatory Visit: Payer: BC Managed Care – PPO | Admitting: Internal Medicine

## 2021-09-21 ENCOUNTER — Ambulatory Visit (INDEPENDENT_AMBULATORY_CARE_PROVIDER_SITE_OTHER): Payer: BC Managed Care – PPO | Admitting: Internal Medicine

## 2021-09-21 ENCOUNTER — Ambulatory Visit: Payer: BC Managed Care – PPO | Admitting: Internal Medicine

## 2021-09-21 ENCOUNTER — Encounter: Payer: Self-pay | Admitting: Internal Medicine

## 2021-09-21 VITALS — BP 130/80 | HR 76 | Temp 98.5°F | Wt 194.6 lb

## 2021-09-21 DIAGNOSIS — N529 Male erectile dysfunction, unspecified: Secondary | ICD-10-CM

## 2021-09-21 DIAGNOSIS — L245 Irritant contact dermatitis due to other chemical products: Secondary | ICD-10-CM

## 2021-09-21 DIAGNOSIS — L2082 Flexural eczema: Secondary | ICD-10-CM | POA: Diagnosis not present

## 2021-09-21 MED ORDER — TADALAFIL 20 MG PO TABS: 20.0000 mg | ORAL_TABLET | Freq: Every day | ORAL | 0 refills | Status: DC | PRN

## 2021-09-21 MED ORDER — TRIAMCINOLONE ACETONIDE 0.1 % EX CREA: 1.0000 "application " | TOPICAL_CREAM | Freq: Two times a day (BID) | CUTANEOUS | 0 refills | Status: DC

## 2021-09-21 NOTE — Progress Notes (Signed)
? ? ? ?Established Patient Office Visit ? ? ? ? ?This visit occurred during the SARS-CoV-2 public health emergency.  Safety protocols were in place, including screening questions prior to the visit, additional usage of staff PPE, and extensive cleaning of exam room while observing appropriate contact time as indicated for disinfecting solutions.  ? ? ?CC/Reason for Visit: Rash on neck, ED ? ?HPI: Christian Howard is a 61 y.o. male who is coming in today for the above mentioned reasons.  He is here today about a pruritic rash on the back of his neck that has been present for about 4 days.  He recently switched laundry detergent to Tide pods and this is when the issue started.  He also routinely shaves his head and back part of his neck.  He is requesting refill of Cialis that he takes for erectile dysfunction. ? ?Past Medical/Surgical History: ?Past Medical History:  ?Diagnosis Date  ? Blood transfusion without reported diagnosis   ? states he had to have transfusion d/t gastric ulcer  ? Gastric ulcer with hemorrhage   ? ? ?No past surgical history on file. ? ?Social History: ? reports that he has been smoking cigarettes. He has a 43.00 pack-year smoking history. He uses smokeless tobacco. He reports current alcohol use. He reports that he does not use drugs. ? ?Allergies: ?No Known Allergies ? ?Family History:  ?Family History  ?Problem Relation Age of Onset  ? Cancer Sister   ? ? ? ?Current Outpatient Medications:  ?  amLODipine (NORVASC) 5 MG tablet, Take 1 tablet (5 mg total) by mouth daily., Disp: 90 tablet, Rfl: 1 ?  atorvastatin (LIPITOR) 20 MG tablet, Take 1 tablet (20 mg total) by mouth daily., Disp: 90 tablet, Rfl: 1 ?  ibuprofen (ADVIL) 400 MG tablet, Take 1 tablet (400 mg total) by mouth every 6 (six) hours as needed for moderate pain., Disp: 10 tablet, Rfl: 0 ?  methocarbamol (ROBAXIN) 500 MG tablet, Take 1 tablet (500 mg total) by mouth every 8 (eight) hours as needed for muscle spasms., Disp: 8 tablet,  Rfl: 0 ?  pantoprazole (PROTONIX) 40 MG tablet, Take 1 tablet (40 mg total) by mouth daily., Disp: 90 tablet, Rfl: 1 ?  tadalafil (CIALIS) 20 MG tablet, Take 1 tablet (20 mg total) by mouth daily as needed for erectile dysfunction., Disp: 10 tablet, Rfl: 0 ?  triamcinolone cream (KENALOG) 0.1 %, Apply 1 application. topically 2 (two) times daily., Disp: 30 g, Rfl: 0 ?  buPROPion (WELLBUTRIN XL) 150 MG 24 hr tablet, Take 1 tablet (150 mg total) by mouth daily. (Patient not taking: Reported on 09/21/2021), Disp: 90 tablet, Rfl: 1 ? ?Review of Systems:  ?Constitutional: Denies fever, chills, diaphoresis, appetite change and fatigue.  ?HEENT: Denies photophobia, eye pain, redness, hearing loss, ear pain, congestion, sore throat, rhinorrhea, sneezing, mouth sores, trouble swallowing, neck pain, neck stiffness and tinnitus.   ?Respiratory: Denies SOB, DOE, cough, chest tightness,  and wheezing.   ?Cardiovascular: Denies chest pain, palpitations and leg swelling.  ?Gastrointestinal: Denies nausea, vomiting, abdominal pain, diarrhea, constipation, blood in stool and abdominal distention.  ?Genitourinary: Denies dysuria, urgency, frequency, hematuria, flank pain and difficulty urinating.  ?Endocrine: Denies: hot or cold intolerance, sweats, changes in hair or nails, polyuria, polydipsia. ?Musculoskeletal: Denies myalgias, back pain, joint swelling, arthralgias and gait problem.  ?Skin: Denies pallor, and wound.  ?Neurological: Denies dizziness, seizures, syncope, weakness, light-headedness, numbness and headaches.  ?Hematological: Denies adenopathy. Easy bruising, personal or family bleeding history  ?  Psychiatric/Behavioral: Denies suicidal ideation, mood changes, confusion, nervousness, sleep disturbance and agitation ? ? ? ?Physical Exam: ?Vitals:  ? 09/21/21 1025  ?BP: 130/80  ?Pulse: 76  ?Temp: 98.5 ?F (36.9 ?C)  ?TempSrc: Oral  ?SpO2: 98%  ?Weight: 194 lb 9.6 oz (88.3 kg)  ? ? ?Body mass index is 29.59  kg/m?. ? ? ?Constitutional: NAD, calm, comfortable ?Eyes: PERRL, lids and conjunctivae normal ?ENMT: Mucous membranes are moist.  ?Skin: Papular, inflamed rash at the back of his neck ?Psychiatric: Normal judgment and insight. Alert and oriented x 3. Normal mood.  ? ? ?Impression and Plan: ? ?Irritant contact dermatitis due to other chemical products - Plan: triamcinolone cream (KENALOG) 0.1 % ? ?Erectile dysfunction, unspecified erectile dysfunction type - Plan: tadalafil (CIALIS) 20 MG tablet ? ?Flexural eczema ? ?-Likely a contact dermatitis from new laundry detergent aggravated by shaving the area. ?-Triamcinolone cream, changing detergent , avoidance of shaving and sunscreen have been advised. ?-Cialis refill provided. ? ? ?Time spent:22 minutes reviewing chart, interviewing and examining patient and formulating plan of care. ? ? ?Patient Instructions  ?-Nice seeing you today!! ? ?-Triamcinolone cream twice daily to back of neck. Wear sunscreen, avoid shaving and switch laundry detergent. ? ? ? ?Chaya Jan, MD ?[facility] ? ? ?

## 2021-09-21 NOTE — Patient Instructions (Signed)
-  Nice seeing you today!! ? ?-Triamcinolone cream twice daily to back of neck. Wear sunscreen, avoid shaving and switch laundry detergent. ?

## 2021-10-12 ENCOUNTER — Ambulatory Visit: Payer: BC Managed Care – PPO | Admitting: Internal Medicine

## 2021-10-12 DIAGNOSIS — Z630 Problems in relationship with spouse or partner: Secondary | ICD-10-CM | POA: Diagnosis not present

## 2021-10-12 DIAGNOSIS — F4320 Adjustment disorder, unspecified: Secondary | ICD-10-CM | POA: Diagnosis not present

## 2021-10-16 ENCOUNTER — Ambulatory Visit (INDEPENDENT_AMBULATORY_CARE_PROVIDER_SITE_OTHER): Payer: BC Managed Care – PPO | Admitting: Internal Medicine

## 2021-10-16 ENCOUNTER — Encounter: Payer: Self-pay | Admitting: Internal Medicine

## 2021-10-16 VITALS — BP 136/78 | HR 85 | Temp 98.2°F | Wt 198.0 lb

## 2021-10-16 DIAGNOSIS — E782 Mixed hyperlipidemia: Secondary | ICD-10-CM

## 2021-10-16 DIAGNOSIS — E559 Vitamin D deficiency, unspecified: Secondary | ICD-10-CM

## 2021-10-16 DIAGNOSIS — E669 Obesity, unspecified: Secondary | ICD-10-CM

## 2021-10-16 DIAGNOSIS — E538 Deficiency of other specified B group vitamins: Secondary | ICD-10-CM | POA: Diagnosis not present

## 2021-10-16 DIAGNOSIS — I10 Essential (primary) hypertension: Secondary | ICD-10-CM

## 2021-10-16 DIAGNOSIS — Z720 Tobacco use: Secondary | ICD-10-CM

## 2021-10-16 DIAGNOSIS — Z1211 Encounter for screening for malignant neoplasm of colon: Secondary | ICD-10-CM

## 2021-10-16 MED ORDER — VALSARTAN-HYDROCHLOROTHIAZIDE 160-25 MG PO TABS: 1.0000 | ORAL_TABLET | Freq: Every day | ORAL | 1 refills | Status: DC

## 2021-10-16 MED ORDER — VITAMIN D (ERGOCALCIFEROL) 1.25 MG (50000 UNIT) PO CAPS: 50000.0000 [IU] | ORAL_CAPSULE | ORAL | 0 refills | Status: AC

## 2021-10-16 MED ORDER — ATORVASTATIN CALCIUM 20 MG PO TABS: 20.0000 mg | ORAL_TABLET | Freq: Every day | ORAL | 1 refills | Status: DC

## 2021-10-16 MED ORDER — CYANOCOBALAMIN 1000 MCG/ML IJ SOLN
1000.0000 ug | Freq: Once | INTRAMUSCULAR | Status: AC
  Administered 2021-10-16: 1000 ug via INTRAMUSCULAR

## 2021-10-16 MED ORDER — AMLODIPINE BESYLATE 5 MG PO TABS: 5.0000 mg | ORAL_TABLET | Freq: Every day | ORAL | 1 refills | Status: DC

## 2021-10-16 NOTE — Progress Notes (Signed)
? ? ? ?Established Patient Office Visit ? ? ? ? ?CC/Reason for Visit: Follow up chronic medical conditions ? ?HPI: Christian Howard is a 61 y.o. male who is coming in today for the above mentioned reasons. Past Medical History is significant for: He has a history of peptic ulcer disease/GERD and tobacco abuse.  At last visit during his physical in August he was diagnosed with vitamin D and B12 deficiency as well as hyperlipidemia and hypertension was started on 5 mg of amlodipine.  He states he has been compliant with it, he has in fact called his wife while in the office to make sure he was taking the right pill bottle that she has been.  He has not taken vitamin D or B12 or cholesterol medication. ? ? ?Past Medical/Surgical History: ?Past Medical History:  ?Diagnosis Date  ? Blood transfusion without reported diagnosis   ? states he had to have transfusion d/t gastric ulcer  ? Gastric ulcer with hemorrhage   ? ? ?No past surgical history on file. ? ?Social History: ? reports that he has been smoking cigarettes. He has a 43.00 pack-year smoking history. He uses smokeless tobacco. He reports current alcohol use. He reports that he does not use drugs. ? ?Allergies: ?No Known Allergies ? ?Family History:  ?Family History  ?Problem Relation Age of Onset  ? Cancer Sister   ? ? ? ?Current Outpatient Medications:  ?  ibuprofen (ADVIL) 400 MG tablet, Take 1 tablet (400 mg total) by mouth every 6 (six) hours as needed for moderate pain., Disp: 10 tablet, Rfl: 0 ?  methocarbamol (ROBAXIN) 500 MG tablet, Take 1 tablet (500 mg total) by mouth every 8 (eight) hours as needed for muscle spasms., Disp: 8 tablet, Rfl: 0 ?  pantoprazole (PROTONIX) 40 MG tablet, Take 1 tablet (40 mg total) by mouth daily., Disp: 90 tablet, Rfl: 1 ?  tadalafil (CIALIS) 20 MG tablet, Take 1 tablet (20 mg total) by mouth daily as needed for erectile dysfunction., Disp: 10 tablet, Rfl: 0 ?  triamcinolone cream (KENALOG) 0.1 %, Apply 1 application.  topically 2 (two) times daily., Disp: 30 g, Rfl: 0 ?  valsartan-hydrochlorothiazide (DIOVAN-HCT) 160-25 MG tablet, Take 1 tablet by mouth daily., Disp: 90 tablet, Rfl: 1 ?  Vitamin D, Ergocalciferol, (DRISDOL) 1.25 MG (50000 UNIT) CAPS capsule, Take 1 capsule (50,000 Units total) by mouth every 7 (seven) days for 12 doses., Disp: 12 capsule, Rfl: 0 ?  amLODipine (NORVASC) 5 MG tablet, Take 1 tablet (5 mg total) by mouth daily., Disp: 90 tablet, Rfl: 1 ?  atorvastatin (LIPITOR) 20 MG tablet, Take 1 tablet (20 mg total) by mouth daily., Disp: 90 tablet, Rfl: 1 ?  buPROPion (WELLBUTRIN XL) 150 MG 24 hr tablet, Take 1 tablet (150 mg total) by mouth daily. (Patient not taking: Reported on 09/21/2021), Disp: 90 tablet, Rfl: 1 ? ?Review of Systems:  ?Constitutional: Denies fever, chills, diaphoresis, appetite change and fatigue.  ?HEENT: Denies photophobia, eye pain, redness, hearing loss, ear pain, congestion, sore throat, rhinorrhea, sneezing, mouth sores, trouble swallowing, neck pain, neck stiffness and tinnitus.   ?Respiratory: Denies SOB, DOE, cough, chest tightness,  and wheezing.   ?Cardiovascular: Denies chest pain, palpitations and leg swelling.  ?Gastrointestinal: Denies nausea, vomiting, abdominal pain, diarrhea, constipation, blood in stool and abdominal distention.  ?Genitourinary: Denies dysuria, urgency, frequency, hematuria, flank pain and difficulty urinating.  ?Endocrine: Denies: hot or cold intolerance, sweats, changes in hair or nails, polyuria, polydipsia. ?Musculoskeletal: Denies myalgias, back pain,  joint swelling, arthralgias and gait problem.  ?Skin: Denies pallor, rash and wound.  ?Neurological: Denies dizziness, seizures, syncope, weakness, light-headedness, numbness and headaches.  ?Hematological: Denies adenopathy. Easy bruising, personal or family bleeding history  ?Psychiatric/Behavioral: Denies suicidal ideation, mood changes, confusion, nervousness, sleep disturbance and  agitation ? ? ? ?Physical Exam: ?Vitals:  ? 10/16/21 1254 10/16/21 1303  ?BP: (!) 160/90 136/78  ?Pulse: 85   ?Temp: 98.2 ?F (36.8 ?C)   ?TempSrc: Oral   ?SpO2: 98%   ?Weight: 198 lb (89.8 kg)   ? ? ?Body mass index is 30.11 kg/m?. ? ? ?Constitutional: NAD, calm, comfortable ?Eyes: PERRL, lids and conjunctivae normal ?ENMT: Mucous membranes are moist.  ?Respiratory: clear to auscultation bilaterally, no wheezing, no crackles. Normal respiratory effort. No accessory muscle use.  ?Cardiovascular: Regular rate and rhythm, no murmurs / rubs / gallops. No extremity edema.  ?Psychiatric: Normal judgment and insight. Alert and oriented x 3. Normal mood.  ? ? ?Impression and Plan: ? ?Screening for malignant neoplasm of colon  ?- Plan: Ambulatory referral to Gastroenterology ? ?Essential hypertension ? - Plan: valsartan-hydrochlorothiazide (DIOVAN-HCT) 160-25 MG tablet ?-Not well controlled on amlodipine 5 mg, add Diovan HCT 160/25 mg and return in 3 months for follow-up. ? ?Vitamin D deficiency ? - Plan: Vitamin D, Ergocalciferol, (DRISDOL) 1.25 MG (50000 UNIT) CAPS capsule ? ?Vitamin B12 deficiency ?-IM B12 today. ? ?Obesity (BMI 30.0-34.9) ?-Discussed healthy lifestyle, including increased physical activity and better food choices to promote weight loss. ? ?Mixed hyperlipidemia ? - Plan: atorvastatin (LIPITOR) 20 MG tablet, recheck lipids in 3 months. ? ?Tobacco abuse ?-No time to address today. ? ? ? ? ?Time spent:34 minutes reviewing chart, interviewing and examining patient and formulating plan of care. ? ? ?Patient Instructions  ?-Nice seeing you today!! ? ?-Continue amlodipine 5 mg daily, add diovan HCT 160/25 mg for blood pressure. ? ?-Start lipitor 20 mg at bedtime for cholesterol. ? ?-Vit D weekly for 12 weeks. ? ?-B12 injection monthly. ? ?-Schedule follow up in 3 months. ? ? ? ?Chaya Jan, MD ?[facility] ? ? ?

## 2021-10-16 NOTE — Patient Instructions (Signed)
-  Nice seeing you today!! ? ?-Continue amlodipine 5 mg daily, add diovan HCT 160/25 mg for blood pressure. ? ?-Start lipitor 20 mg at bedtime for cholesterol. ? ?-Vit D weekly for 12 weeks. ? ?-B12 injection monthly. ? ?-Schedule follow up in 3 months. ?

## 2021-10-19 IMAGING — CT CT ABD-PELV W/ CM
2 of 5 series · 16 of 46 positions shown, 18 images · IV contrast (OMNIPAQUE 300)
Comparison: None.

CLINICAL DATA: Left lower quadrant pain and left flank pain.

EXAM:
CT ABDOMEN AND PELVIS WITH CONTRAST
TECHNIQUE: Multidetector CT imaging of the abdomen and pelvis was performed
using the standard protocol following bolus administration of
intravenous contrast.
CONTRAST:  100mL OMNIPAQUE IOHEXOL 300 MG/ML  SOLN

[Series 2: axial st · axial · 0.72mm/px · z∈[-570,-155]mm · 13 of 97 slices shown, 15 images]
[im 7/97  soft-tissue]
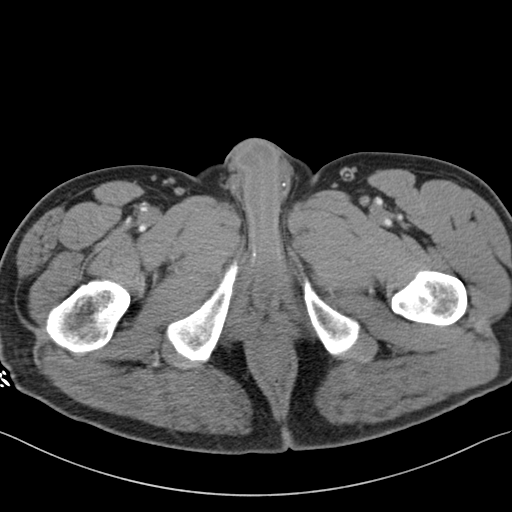
[im 7/97  bone]
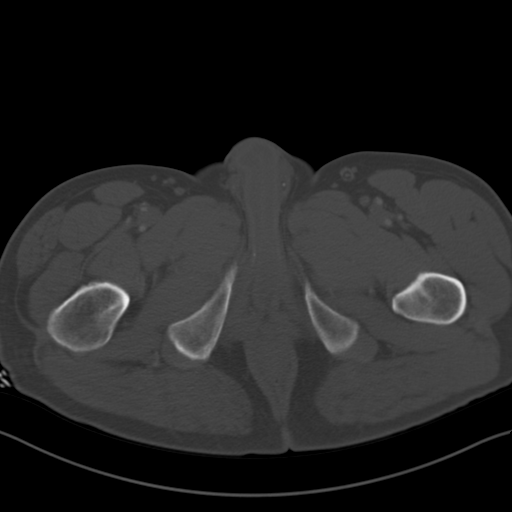
[im 13/97  soft-tissue]
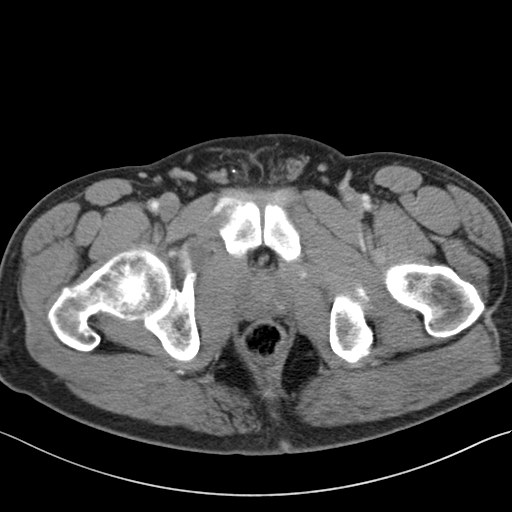
[im 20/97  soft-tissue]
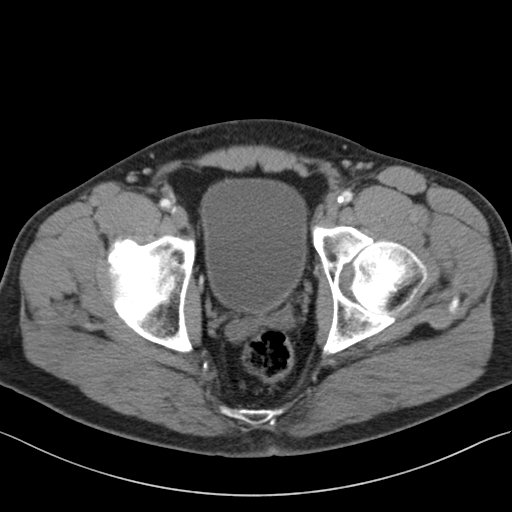
[im 26/97  soft-tissue]
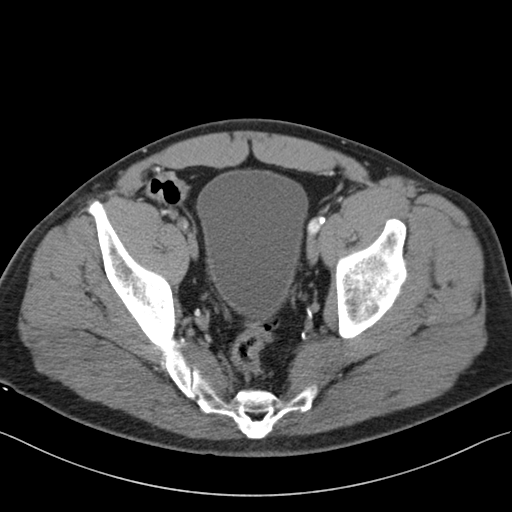
[im 33/97  soft-tissue]
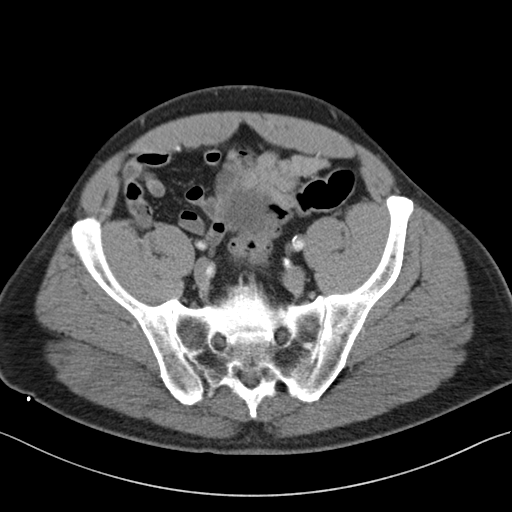
[im 39/97  soft-tissue]
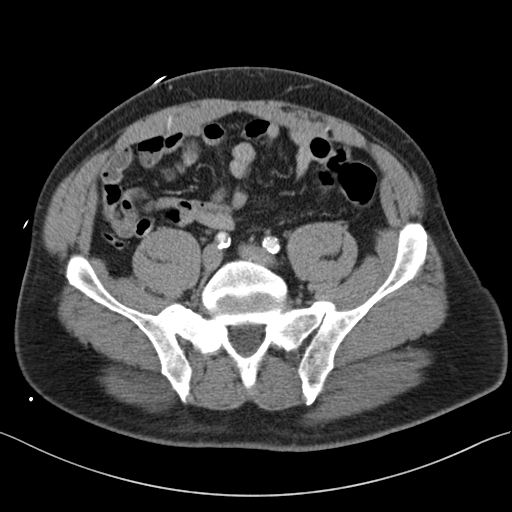
[im 52/97  soft-tissue]
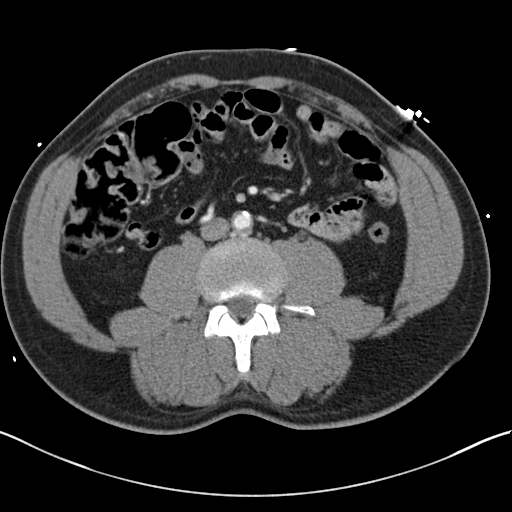
[im 58/97  soft-tissue]
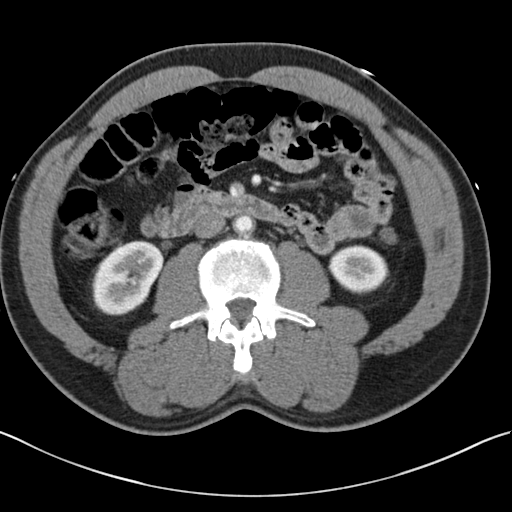
[im 65/97  soft-tissue]
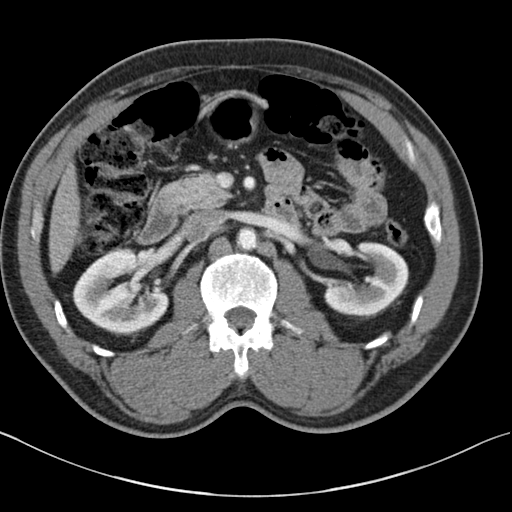
[im 65/97  bone]
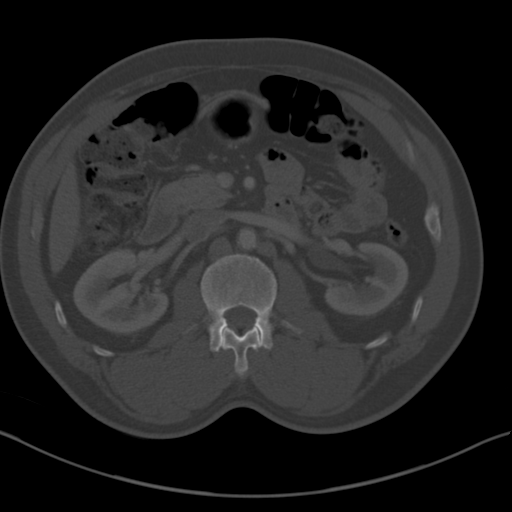
[im 71/97  soft-tissue]
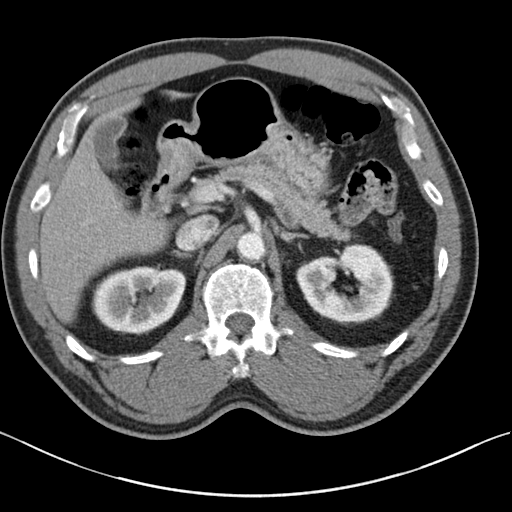
[im 77/97  soft-tissue]
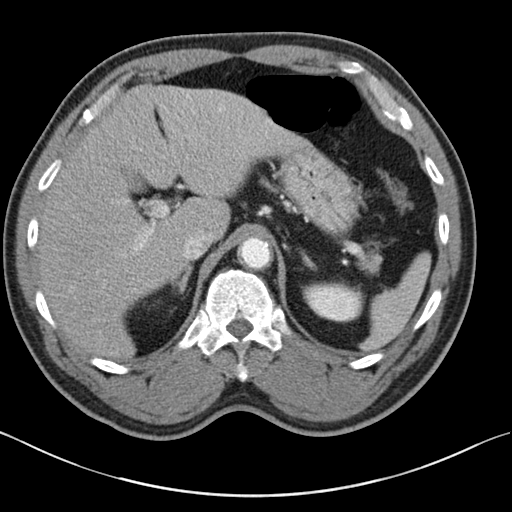
[im 84/97  soft-tissue]
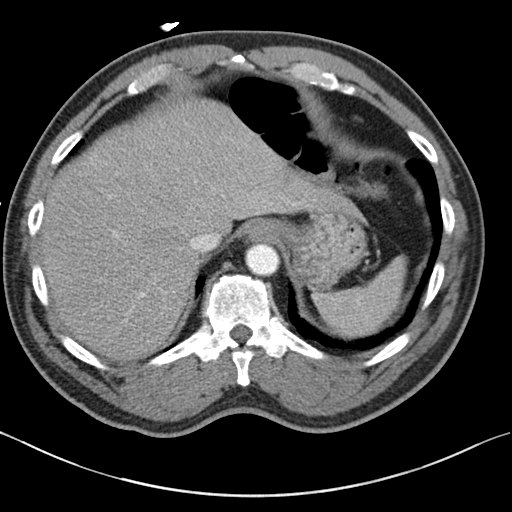
[im 90/97  soft-tissue]
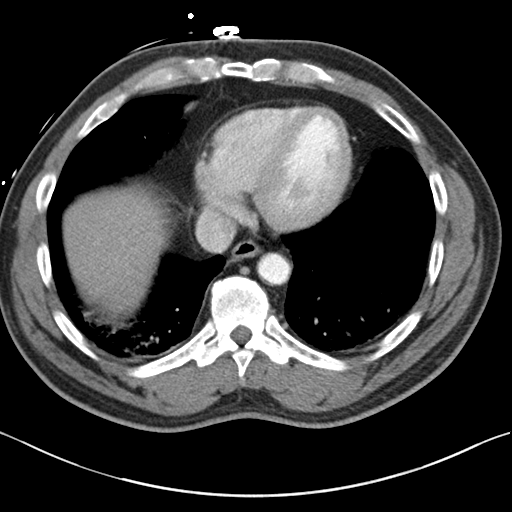

[Series 5: coronal st · coronal · 0.68mm/px · 3 of 126 slices shown]
[im 42/126  soft-tissue]
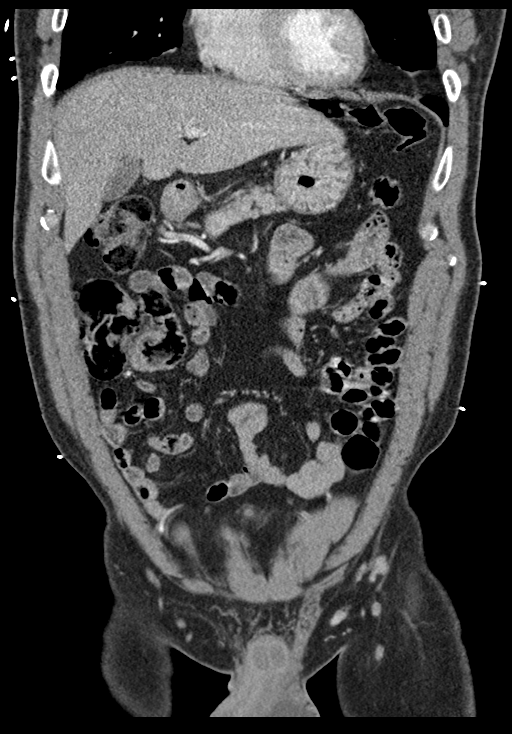
[im 56/126  soft-tissue]
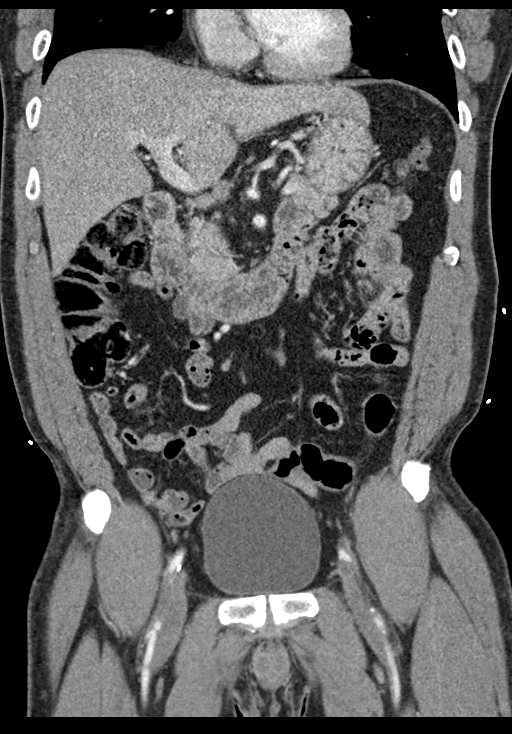
[im 70/126  soft-tissue]
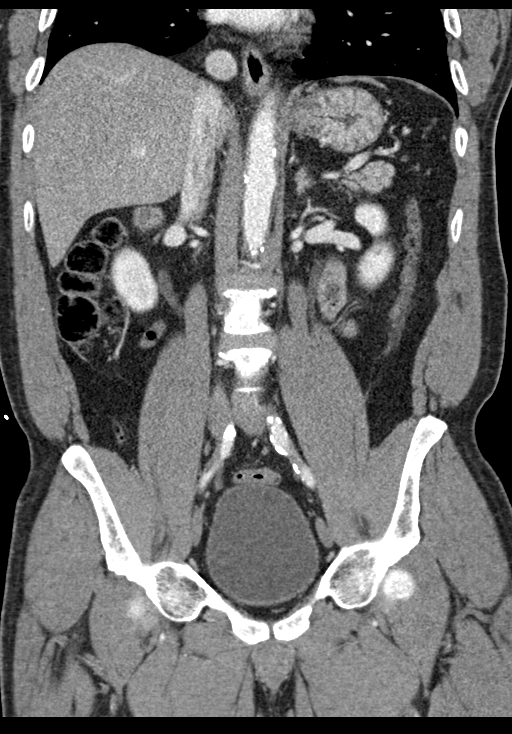

[16 of 46 positions shown; findings below may reference images not displayed]

FINDINGS: Lower Chest: No acute findings.

Hepatobiliary: No hepatic masses identified. Gallbladder is
unremarkable. No evidence of biliary ductal dilatation.

Pancreas:  No mass or inflammatory changes.

Spleen: Within normal limits in size and appearance.

Adrenals/Urinary Tract: No masses identified. No evidence of
ureteral calculi or hydronephrosis.

Stomach/Bowel: No evidence of obstruction, inflammatory process or
abnormal fluid collections. No evidence of diverticular disease.
Normal appendix visualized.

Vascular/Lymphatic: No pathologically enlarged lymph nodes. No
abdominal aortic aneurysm. Aortic atherosclerosis incidentally
noted.

Reproductive:  No mass or other significant abnormality.

Other:  None.

Musculoskeletal:  No suspicious bone lesions identified.
IMPRESSION: No acute findings or other significant abnormality identified.

Aortic Atherosclerosis ([id number].H).

## 2021-10-20 ENCOUNTER — Other Ambulatory Visit: Payer: Self-pay | Admitting: Internal Medicine

## 2021-10-20 DIAGNOSIS — N529 Male erectile dysfunction, unspecified: Secondary | ICD-10-CM

## 2021-10-20 MED ORDER — TADALAFIL 20 MG PO TABS: 20.0000 mg | ORAL_TABLET | Freq: Every day | ORAL | 0 refills | Status: DC | PRN

## 2021-11-09 ENCOUNTER — Encounter: Payer: Self-pay | Admitting: Internal Medicine

## 2021-11-09 DIAGNOSIS — N529 Male erectile dysfunction, unspecified: Secondary | ICD-10-CM

## 2021-11-16 MED ORDER — TADALAFIL 20 MG PO TABS: 20.0000 mg | ORAL_TABLET | Freq: Every day | ORAL | 0 refills | Status: DC | PRN

## 2021-12-07 DIAGNOSIS — Z630 Problems in relationship with spouse or partner: Secondary | ICD-10-CM | POA: Diagnosis not present

## 2021-12-07 DIAGNOSIS — F4320 Adjustment disorder, unspecified: Secondary | ICD-10-CM | POA: Diagnosis not present

## 2022-01-19 ENCOUNTER — Encounter: Payer: Self-pay | Admitting: Internal Medicine

## 2022-01-20 ENCOUNTER — Encounter (HOSPITAL_COMMUNITY): Payer: Self-pay | Admitting: *Deleted

## 2022-01-20 ENCOUNTER — Emergency Department (HOSPITAL_COMMUNITY)
Admission: EM | Admit: 2022-01-20 | Discharge: 2022-01-20 | Disposition: A | Discharge status: Home / Self Care | Payer: BC Managed Care – PPO | Attending: Emergency Medicine | Admitting: Emergency Medicine

## 2022-01-20 ENCOUNTER — Emergency Department (HOSPITAL_COMMUNITY): Payer: BC Managed Care – PPO

## 2022-01-20 ENCOUNTER — Other Ambulatory Visit: Payer: Self-pay

## 2022-01-20 DIAGNOSIS — R0789 Other chest pain: Secondary | ICD-10-CM | POA: Diagnosis not present

## 2022-01-20 DIAGNOSIS — R079 Chest pain, unspecified: Secondary | ICD-10-CM | POA: Diagnosis not present

## 2022-01-20 DIAGNOSIS — R059 Cough, unspecified: Secondary | ICD-10-CM | POA: Diagnosis not present

## 2022-01-20 DIAGNOSIS — U071 COVID-19: Secondary | ICD-10-CM

## 2022-01-20 DIAGNOSIS — R0981 Nasal congestion: Secondary | ICD-10-CM | POA: Diagnosis not present

## 2022-01-20 HISTORY — DX: Essential (primary) hypertension: I10

## 2022-01-20 LAB — BASIC METABOLIC PANEL
Anion gap: 10 (ref 5–15)
BUN: 9 mg/dL (ref 8–23)
CO2: 21 mmol/L — ABNORMAL LOW (ref 22–32)
Calcium: 8.8 mg/dL — ABNORMAL LOW (ref 8.9–10.3)
Chloride: 102 mmol/L (ref 98–111)
Creatinine, Ser: 1.35 mg/dL — ABNORMAL HIGH (ref 0.61–1.24)
GFR, Estimated: 59 mL/min — ABNORMAL LOW (ref >60)
Glucose, Bld: 93 mg/dL (ref 70–99)
Potassium: 4 mmol/L (ref 3.5–5.1)
Sodium: 133 mmol/L — ABNORMAL LOW (ref 135–145)

## 2022-01-20 LAB — CBC
HCT: 48.2 % (ref 39.0–52.0)
Hemoglobin: 17.2 g/dL — ABNORMAL HIGH (ref 13.0–17.0)
MCH: 31.7 pg (ref 26.0–34.0)
MCHC: 35.7 g/dL (ref 30.0–36.0)
MCV: 88.8 fL (ref 80.0–100.0)
Platelets: 242 10*3/uL (ref 150–400)
RBC: 5.43 MIL/uL (ref 4.22–5.81)
RDW: 13.7 % (ref 11.5–15.5)
WBC: 6.9 10*3/uL (ref 4.0–10.5)
nRBC: 0 % (ref 0.0–0.2)

## 2022-01-20 LAB — TROPONIN I (HIGH SENSITIVITY): Troponin I (High Sensitivity): 5 ng/L (ref <18)

## 2022-01-20 MED ORDER — BENZONATATE 100 MG PO CAPS: 100.0000 mg | ORAL_CAPSULE | Freq: Three times a day (TID) | ORAL | 0 refills | Status: DC

## 2022-01-20 MED ORDER — ONDANSETRON 4 MG PO TBDP: ORAL_TABLET | ORAL | 0 refills | Status: DC

## 2022-01-20 NOTE — Discharge Instructions (Signed)
You have covid.  Unfortunately you tend to not feel well for a couple weeks. Follow up with your family doc in the office.   Take tylenol 2 pills 4 times a day and motrin 4 pills 3 times a day.  Drink plenty of fluids.  Return for worsening shortness of breath, headache, confusion. Follow up with your family doctor.

## 2022-01-20 NOTE — ED Triage Notes (Signed)
Pt states Covid symptoms x 3 days and tested positive yesterday.  Came to ED today d/c coughing, chest pain and loss of appetite.

## 2022-01-20 NOTE — ED Notes (Signed)
Pt states he is having chest pain. EKG completed.

## 2022-01-20 NOTE — ED Provider Notes (Signed)
[facility] EMERGENCY DEPARTMENT Provider Note   CSN: [id number] Arrival date & time: 01/20/22  [id number]     History  Chief Complaint  Patient presents with   Chest Pain   Covid Positive    Christian Howard is a 61 y.o. male.  61 yo M with chief complaints of feeling bad.  The patient tells me that he has been sick for about 3 days now.  Cough congestion fevers chills myalgias.  His wife has a similar illness and was diagnosed with COVID.  He took a home test that was also positive.  He sent his family doctor message about getting antibiotic therapy and decided come here for evaluation.   Chest Pain      Home Medications Prior to Admission medications   Medication Sig Start Date End Date Taking? Authorizing Provider  benzonatate (TESSALON) 100 MG capsule Take 1 capsule (100 mg total) by mouth every 8 (eight) hours. 01/20/22  Yes Deno Etienne, DO  ondansetron (ZOFRAN-ODT) 4 MG disintegrating tablet 4mg  ODT q4 hours prn nausea/vomit 01/20/22  Yes Deno Etienne, DO  amLODipine (NORVASC) 5 MG tablet Take 1 tablet (5 mg total) by mouth daily. 10/16/21   Isaac Bliss, Rayford Halsted, MD  atorvastatin (LIPITOR) 20 MG tablet Take 1 tablet (20 mg total) by mouth daily. 10/16/21   Isaac Bliss, Rayford Halsted, MD  buPROPion (WELLBUTRIN XL) 150 MG 24 hr tablet Take 1 tablet (150 mg total) by mouth daily. Patient not taking: Reported on 09/21/2021 01/19/21   Isaac Bliss, Rayford Halsted, MD  ibuprofen (ADVIL) 400 MG tablet Take 1 tablet (400 mg total) by mouth every 6 (six) hours as needed for moderate pain. 12/03/20   Pearson Grippe, DO  methocarbamol (ROBAXIN) 500 MG tablet Take 1 tablet (500 mg total) by mouth every 8 (eight) hours as needed for muscle spasms. 06/23/19   Davonna Belling, MD  pantoprazole (PROTONIX) 40 MG tablet Take 1 tablet (40 mg total) by mouth daily. 01/19/21   Isaac Bliss, Rayford Halsted, MD  tadalafil (CIALIS) 20 MG tablet Take 1 tablet (20 mg total) by mouth daily as needed for  erectile dysfunction. 11/16/21   Isaac Bliss, Rayford Halsted, MD  triamcinolone cream (KENALOG) 0.1 % Apply 1 application. topically 2 (two) times daily. 09/21/21   Isaac Bliss, Rayford Halsted, MD  valsartan-hydrochlorothiazide (DIOVAN-HCT) 160-25 MG tablet Take 1 tablet by mouth daily. 10/16/21   Isaac Bliss, Rayford Halsted, MD      Allergies    Patient has no known allergies.    Review of Systems   Review of Systems  Cardiovascular:  Positive for chest pain.    Physical Exam Updated Vital Signs BP 136/79 (BP Location: Right Arm)   Pulse 85   Temp 99.2 F (37.3 C) (Oral)   Resp 16   Ht 5\' 8"  (1.727 m)   Wt 89.8 kg   SpO2 98%   BMI 30.10 kg/m  Physical Exam Vitals and nursing note reviewed.  Constitutional:      Appearance: He is well-developed.  HENT:     Head: Normocephalic and atraumatic.  Eyes:     Pupils: Pupils are equal, round, and reactive to light.  Neck:     Vascular: No JVD.  Cardiovascular:     Rate and Rhythm: Normal rate and regular rhythm.     Heart sounds: No murmur heard.    No friction rub. No gallop.  Pulmonary:     Effort: No respiratory distress.     Breath  sounds: No wheezing.  Abdominal:     General: There is no distension.     Tenderness: There is no abdominal tenderness. There is no guarding or rebound.  Musculoskeletal:        General: Normal range of motion.     Cervical back: Normal range of motion and neck supple.  Skin:    Coloration: Skin is not pale.     Findings: No rash.  Neurological:     Mental Status: He is alert and oriented to person, place, and time.  Psychiatric:        Behavior: Behavior normal.     ED Results / Procedures / Treatments   Labs (all labs ordered are listed, but only abnormal results are displayed) Labs Reviewed  BASIC METABOLIC PANEL - Abnormal; Notable for the following components:      Result Value   Sodium 133 (*)    CO2 21 (*)    Creatinine, Ser 1.35 (*)    Calcium 8.8 (*)    GFR, Estimated 59 (*)     All other components within normal limits  CBC - Abnormal; Notable for the following components:   Hemoglobin 17.2 (*)    All other components within normal limits  TROPONIN I (HIGH SENSITIVITY)  TROPONIN I (HIGH SENSITIVITY)    EKG EKG Interpretation  Date/Time:  Saturday January 20 2022 09:20:25 EDT Ventricular Rate:  100 PR Interval:  130 QRS Duration: 74 QT Interval:  346 QTC Calculation: 446 R Axis:   62 Text Interpretation: Normal sinus rhythm Nonspecific T wave abnormality Abnormal ECG No significant change since last tracing Confirmed by Melene Plan [phone]) on 01/20/2022 10:29:07 AM  Radiology DG Chest Portable 1 View  Result Date: 01/20/2022 CLINICAL DATA:  Chest pain, cough.  COVID positive. EXAM: PORTABLE CHEST 1 VIEW COMPARISON:  06/13/2018. FINDINGS: Cardiac silhouette is normal in size and configuration. Normal mediastinal and hilar contours. Small nodule suggested in the right mid to lower lung near the minor fissure, stable compared to the prior chest radiograph, likely a nipple shadow. No follow-up recommended given the stability since 2019. Lungs are hyperexpanded, but otherwise clear. No pleural effusion or pneumothorax. IMPRESSION: No active disease. Electronically Signed   By: Amie Portland M.D.   On: 01/20/2022 10:30    Procedures Procedures    Medications Ordered in ED Medications - No data to display  ED Course/ Medical Decision Making/ A&P                           Medical Decision Making Amount and/or Complexity of Data Reviewed Labs: ordered. Radiology: ordered.  Risk Prescription drug management.   61 yo M with a chief complaints of cough congestion fevers chills myalgias going on for about 3 days now.  He is well-appearing and nontoxic clear lung sounds for me.  Not hypoxic.  Mild AKI on lab work.  Troponin negative.  Chest x-ray independently interpreted by me without focal infiltrate.  He is COVID-positive.  I discussed risk and benefits of  antiviral therapy he will decline at this time.  We will have him treat supportively.  PCP follow-up  3:09 PM:  I have discussed the diagnosis/risks/treatment options with the patient.  Evaluation and diagnostic testing in the emergency department does not suggest an emergent condition requiring admission or immediate intervention beyond what has been performed at this time.  They will follow up with  PCP. We also discussed returning to the ED  immediately if new or worsening sx occur. We discussed the sx which are most concerning (e.g., sudden worsening pain, fever, inability to tolerate by mouth) that necessitate immediate return. Medications administered to the patient during their visit and any new prescriptions provided to the patient are listed below.  Medications given during this visit Medications - No data to display   The patient appears reasonably screen and/or stabilized for discharge and I doubt any other medical condition or other [facility] requiring further screening, evaluation, or treatment in the ED at this time prior to discharge.          Final Clinical Impression(s) / ED Diagnoses Final diagnoses:  COVID-19 virus infection    Rx / DC Orders ED Discharge Orders          Ordered    benzonatate (TESSALON) 100 MG capsule  Every 8 hours        01/20/22 1116    ondansetron (ZOFRAN-ODT) 4 MG disintegrating tablet        01/20/22 1116              Lorraine, DO 01/20/22 1509

## 2022-02-08 ENCOUNTER — Encounter: Payer: Self-pay | Admitting: Internal Medicine

## 2022-02-08 ENCOUNTER — Ambulatory Visit (INDEPENDENT_AMBULATORY_CARE_PROVIDER_SITE_OTHER): Payer: BC Managed Care – PPO | Admitting: Internal Medicine

## 2022-02-08 VITALS — BP 110/68 | HR 85 | Temp 98.3°F | Ht 68.5 in | Wt 195.7 lb

## 2022-02-08 DIAGNOSIS — Z23 Encounter for immunization: Secondary | ICD-10-CM | POA: Diagnosis not present

## 2022-02-08 DIAGNOSIS — E559 Vitamin D deficiency, unspecified: Secondary | ICD-10-CM

## 2022-02-08 DIAGNOSIS — E782 Mixed hyperlipidemia: Secondary | ICD-10-CM

## 2022-02-08 DIAGNOSIS — E538 Deficiency of other specified B group vitamins: Secondary | ICD-10-CM | POA: Diagnosis not present

## 2022-02-08 DIAGNOSIS — Z1211 Encounter for screening for malignant neoplasm of colon: Secondary | ICD-10-CM | POA: Diagnosis not present

## 2022-02-08 DIAGNOSIS — Z0000 Encounter for general adult medical examination without abnormal findings: Secondary | ICD-10-CM

## 2022-02-08 DIAGNOSIS — I10 Essential (primary) hypertension: Secondary | ICD-10-CM | POA: Diagnosis not present

## 2022-02-08 DIAGNOSIS — F17210 Nicotine dependence, cigarettes, uncomplicated: Secondary | ICD-10-CM

## 2022-02-08 DIAGNOSIS — K279 Peptic ulcer, site unspecified, unspecified as acute or chronic, without hemorrhage or perforation: Secondary | ICD-10-CM

## 2022-02-08 DIAGNOSIS — N529 Male erectile dysfunction, unspecified: Secondary | ICD-10-CM

## 2022-02-08 DIAGNOSIS — Z720 Tobacco use: Secondary | ICD-10-CM

## 2022-02-08 LAB — COMPREHENSIVE METABOLIC PANEL
ALT: 20 U/L (ref 0–53)
AST: 22 U/L (ref 0–37)
Albumin: 4 g/dL (ref 3.5–5.2)
Alkaline Phosphatase: 66 U/L (ref 39–117)
BUN: 14 mg/dL (ref 6–23)
CO2: 23 mEq/L (ref 19–32)
Calcium: 8.9 mg/dL (ref 8.4–10.5)
Chloride: 105 mEq/L (ref 96–112)
Creatinine, Ser: 1.06 mg/dL (ref 0.40–1.50)
GFR: 75.27 mL/min (ref >60.00)
Glucose, Bld: 92 mg/dL (ref 70–99)
Potassium: 3.9 mEq/L (ref 3.5–5.1)
Sodium: 135 mEq/L (ref 135–145)
Total Bilirubin: 0.8 mg/dL (ref 0.2–1.2)
Total Protein: 7 g/dL (ref 6.0–8.3)

## 2022-02-08 LAB — CBC WITH DIFFERENTIAL/PLATELET
Basophils Absolute: 0 10*3/uL (ref 0.0–0.1)
Basophils Relative: 0.4 % (ref 0.0–3.0)
Eosinophils Absolute: 0.1 10*3/uL (ref 0.0–0.7)
Eosinophils Relative: 1.1 % (ref 0.0–5.0)
HCT: 47.1 % (ref 39.0–52.0)
Hemoglobin: 15.9 g/dL (ref 13.0–17.0)
Lymphocytes Relative: 25.8 % (ref 12.0–46.0)
Lymphs Abs: 2.2 10*3/uL (ref 0.7–4.0)
MCHC: 33.7 g/dL (ref 30.0–36.0)
MCV: 92.1 fl (ref 78.0–100.0)
Monocytes Absolute: 0.6 10*3/uL (ref 0.1–1.0)
Monocytes Relative: 6.9 % (ref 3.0–12.0)
Neutro Abs: 5.7 10*3/uL (ref 1.4–7.7)
Neutrophils Relative %: 65.8 % (ref 43.0–77.0)
Platelets: 248 10*3/uL (ref 150.0–400.0)
RBC: 5.11 Mil/uL (ref 4.22–5.81)
RDW: 14.7 % (ref 11.5–15.5)
WBC: 8.7 10*3/uL (ref 4.0–10.5)

## 2022-02-08 LAB — LIPID PANEL
Cholesterol: 144 mg/dL (ref 0–200)
HDL: 37.4 mg/dL — ABNORMAL LOW (ref >39.00)
LDL Cholesterol: 89 mg/dL (ref 0–99)
NonHDL: 106.8
Total CHOL/HDL Ratio: 4
Triglycerides: 88 mg/dL (ref 0.0–149.0)
VLDL: 17.6 mg/dL (ref 0.0–40.0)

## 2022-02-08 LAB — PSA: PSA: 0.39 ng/mL (ref 0.10–4.00)

## 2022-02-08 LAB — TSH: TSH: 0.71 u[IU]/mL (ref 0.35–5.50)

## 2022-02-08 LAB — VITAMIN D 25 HYDROXY (VIT D DEFICIENCY, FRACTURES): VITD: 29.26 ng/mL — ABNORMAL LOW (ref 30.00–100.00)

## 2022-02-08 LAB — VITAMIN B12: Vitamin B-12: 381 pg/mL (ref 211–911)

## 2022-02-08 LAB — HEMOGLOBIN A1C: Hgb A1c MFr Bld: 6.3 % (ref 4.6–6.5)

## 2022-02-08 MED ORDER — BUPROPION HCL ER (XL) 150 MG PO TB24: 150.0000 mg | ORAL_TABLET | Freq: Every day | ORAL | 1 refills | Status: DC

## 2022-02-08 MED ORDER — ATORVASTATIN CALCIUM 20 MG PO TABS: 20.0000 mg | ORAL_TABLET | Freq: Every day | ORAL | 1 refills | Status: DC

## 2022-02-08 MED ORDER — VALSARTAN-HYDROCHLOROTHIAZIDE 160-25 MG PO TABS
1.0000 | ORAL_TABLET | Freq: Every day | ORAL | 1 refills | Status: DC
Start: 2022-02-08 — End: 2023-04-11

## 2022-02-08 MED ORDER — PANTOPRAZOLE SODIUM 40 MG PO TBEC: 40.0000 mg | DELAYED_RELEASE_TABLET | Freq: Every day | ORAL | 1 refills | Status: DC

## 2022-02-08 MED ORDER — AMLODIPINE BESYLATE 5 MG PO TABS: 5.0000 mg | ORAL_TABLET | Freq: Every day | ORAL | 1 refills | Status: DC

## 2022-02-08 MED ORDER — TADALAFIL 20 MG PO TABS: 20.0000 mg | ORAL_TABLET | Freq: Every day | ORAL | 0 refills | Status: DC | PRN

## 2022-02-08 NOTE — Progress Notes (Signed)
Established Patient Office Visit     CC/Reason for Visit: Annual preventive exam, discuss smoking cessation  HPI: Christian Howard is a 61 y.o. male who is coming in today for the above mentioned reasons. Past Medical History is significant for: Ongoing nicotine dependence, peptic ulcer disease and GERD, hypertension, hyperlipidemia, vitamin D and B12 deficiencies.  He was seen in the emergency department on August 5 and diagnosed with COVID.  He was not given COVID directed treatment.  Was treated supportively.  He still has a little bit of a cough.  He is overdue for initial colonoscopy.  He qualifies for lung cancer screening.  Other than his remaining cough he feels well.   Past Medical/Surgical History: Past Medical History:  Diagnosis Date   Blood transfusion without reported diagnosis    states he had to have transfusion d/t gastric ulcer   Gastric ulcer with hemorrhage    Hypertension     No past surgical history on file.  Social History:  reports that he has been smoking cigarettes. He has a 43.00 pack-year smoking history. He uses smokeless tobacco. He reports current alcohol use. He reports that he does not use drugs.  Allergies: No Known Allergies  Family History:  Family History  Problem Relation Age of Onset   Cancer Sister      Current Outpatient Medications:    ibuprofen (ADVIL) 400 MG tablet, Take 1 tablet (400 mg total) by mouth every 6 (six) hours as needed for moderate pain., Disp: 10 tablet, Rfl: 0   methocarbamol (ROBAXIN) 500 MG tablet, Take 1 tablet (500 mg total) by mouth every 8 (eight) hours as needed for muscle spasms., Disp: 8 tablet, Rfl: 0   ondansetron (ZOFRAN-ODT) 4 MG disintegrating tablet, 4mg  ODT q4 hours prn nausea/vomit, Disp: 20 tablet, Rfl: 0   tadalafil (CIALIS) 20 MG tablet, Take 1 tablet (20 mg total) by mouth daily as needed for erectile dysfunction., Disp: 10 tablet, Rfl: 0   triamcinolone cream (KENALOG) 0.1 %, Apply 1  application. topically 2 (two) times daily., Disp: 30 g, Rfl: 0   amLODipine (NORVASC) 5 MG tablet, Take 1 tablet (5 mg total) by mouth daily., Disp: 90 tablet, Rfl: 1   atorvastatin (LIPITOR) 20 MG tablet, Take 1 tablet (20 mg total) by mouth daily., Disp: 90 tablet, Rfl: 1   buPROPion (WELLBUTRIN XL) 150 MG 24 hr tablet, Take 1 tablet (150 mg total) by mouth daily., Disp: 90 tablet, Rfl: 1   pantoprazole (PROTONIX) 40 MG tablet, Take 1 tablet (40 mg total) by mouth daily., Disp: 90 tablet, Rfl: 1   valsartan-hydrochlorothiazide (DIOVAN-HCT) 160-25 MG tablet, Take 1 tablet by mouth daily., Disp: 90 tablet, Rfl: 1  Review of Systems:  Constitutional: Denies fever, chills, diaphoresis, appetite change and fatigue.  HEENT: Denies photophobia, eye pain, redness,  mouth sores, trouble swallowing, neck pain, neck stiffness and tinnitus.   Respiratory: Denies SOB, DOE, chest tightness,  and wheezing.   Cardiovascular: Denies chest pain, palpitations and leg swelling.  Gastrointestinal: Denies nausea, vomiting, abdominal pain, diarrhea, constipation, blood in stool and abdominal distention.  Genitourinary: Denies dysuria, urgency, frequency, hematuria, flank pain and difficulty urinating.  Endocrine: Denies: hot or cold intolerance, sweats, changes in hair or nails, polyuria, polydipsia. Musculoskeletal: Denies myalgias, back pain, joint swelling, arthralgias and gait problem.  Skin: Denies pallor, rash and wound.  Neurological: Denies dizziness, seizures, syncope, weakness, light-headedness, numbness and headaches.  Hematological: Denies adenopathy. Easy bruising, personal or family bleeding history  Psychiatric/Behavioral: Denies suicidal ideation, mood changes, confusion, nervousness, sleep disturbance and agitation    Physical Exam: Vitals:   02/08/22 0717  BP: 110/68  Pulse: 85  Temp: 98.3 F (36.8 C)  TempSrc: Oral  SpO2: 98%  Weight: 195 lb 11.2 oz (88.8 kg)  Height: 5' 8.5" (1.74 m)     Body mass index is 29.32 kg/m.   Constitutional: NAD, calm, comfortable Eyes: PERRL, lids and conjunctivae normal ENMT: Mucous membranes are moist. Posterior pharynx clear of any exudate or lesions. Normal dentition. Tympanic membrane is pearly white, no erythema or bulging. Neck: normal, supple, no masses, no thyromegaly Respiratory: clear to auscultation bilaterally, no wheezing, no crackles. Normal respiratory effort. No accessory muscle use.  Cardiovascular: Regular rate and rhythm, no murmurs / rubs / gallops. No extremity edema. 2+ pedal pulses. No carotid bruits.  Abdomen: no tenderness, no masses palpated. No hepatosplenomegaly. Bowel sounds positive.  Musculoskeletal: no clubbing / cyanosis. No joint deformity upper and lower extremities. Good ROM, no contractures. Normal muscle tone.  Skin: no rashes, lesions, ulcers. No induration Neurologic: CN 2-12 grossly intact. Sensation intact, DTR normal. Strength 5/5 in all 4.  Psychiatric: Normal judgment and insight. Alert and oriented x 3. Normal mood.    Impression and Plan:  Encounter for preventive health examination - Plan: PSA, Ambulatory referral to Ophthalmology -Recommend routine eye and dental care. -Immunizations: Flu vaccine in office, advised COVID at pharmacy, other immunizations are up-to-date -Healthy lifestyle discussed in detail. -Labs to be updated today. -Colon cancer screening: Overdue, GI referral -Breast cancer screening: Not applicable -Cervical cancer screening: Not applicable -Lung cancer screening: Ordered -Prostate cancer screening: PSA today -DEXA: Not applicable  Screening for malignant neoplasm of colon  - Plan: Ambulatory referral to Gastroenterology  Need for influenza vaccination -Flu vaccine administered today  Essential hypertension - Plan: CBC with Differential/Platelet, Comprehensive metabolic panel, Hemoglobin A1c, TSH, valsartan-hydrochlorothiazide (DIOVAN-HCT) 160-25 MG  tablet  Mixed hyperlipidemia  - Plan: Lipid panel, atorvastatin (LIPITOR) 20 MG tablet  Vitamin D deficiency  - Plan: VITAMIN D 25 Hydroxy (Vit-D Deficiency, Fractures)  Vitamin B12 deficiency  - Plan: Vitamin B12  PUD (peptic ulcer disease)  - Plan: pantoprazole (PROTONIX) 40 MG tablet  Cigarette nicotine dependence without complication  - Plan: Ambulatory Referral Lung Cancer Screening [facility] I have discussed tobacco cessation with the patient.  I have counseled the patient regarding the negative impacts of continued tobacco use including but not limited to lung cancer, COPD, and cardiovascular disease.  I have discussed alternatives to tobacco and modalities that may help facilitate tobacco cessation including but not limited to biofeedback, hypnosis, and medications.  Total time spent with tobacco counseling was 3 minutes. -He is in the active phase of change.  Start Wellbutrin, I will see him back in 3 months. -Referred to the lung cancer screening program.  Primary hypertension  -Well-controlled.  On amlodipine and Diovan HCT. - Plan: amLODipine (NORVASC) 5 MG tablet    Patient Instructions  -Nice seeing you today!!  -Lab work today; will notify you once results are available.  -Remember COVID vaccine.  -Flu vaccine in office today.  -Schedule follow up in 3 months.  -Start wellbutrin 150 mg daily for smoking.      Chaya Jan, MD [facility]

## 2022-02-08 NOTE — Addendum Note (Signed)
Addended by: Kern Reap B on: 02/08/2022 08:48 AM   Modules accepted: Orders

## 2022-02-08 NOTE — Patient Instructions (Signed)
-  Nice seeing you today!!  -Lab work today; will notify you once results are available.  -Remember COVID vaccine.  -Flu vaccine in office today.  -Schedule follow up in 3 months.  -Start wellbutrin 150 mg daily for smoking.

## 2022-02-13 ENCOUNTER — Other Ambulatory Visit: Payer: Self-pay | Admitting: Internal Medicine

## 2022-02-13 ENCOUNTER — Encounter: Payer: Self-pay | Admitting: Internal Medicine

## 2022-02-13 DIAGNOSIS — R7302 Impaired glucose tolerance (oral): Secondary | ICD-10-CM | POA: Insufficient documentation

## 2022-02-13 DIAGNOSIS — E559 Vitamin D deficiency, unspecified: Secondary | ICD-10-CM

## 2022-02-13 DIAGNOSIS — N529 Male erectile dysfunction, unspecified: Secondary | ICD-10-CM

## 2022-02-13 MED ORDER — VITAMIN D (ERGOCALCIFEROL) 1.25 MG (50000 UNIT) PO CAPS: 50000.0000 [IU] | ORAL_CAPSULE | ORAL | 0 refills | Status: DC

## 2022-02-14 MED ORDER — TADALAFIL 20 MG PO TABS: 20.0000 mg | ORAL_TABLET | Freq: Every day | ORAL | 0 refills | Status: DC | PRN

## 2022-02-14 MED ORDER — VITAMIN D (ERGOCALCIFEROL) 1.25 MG (50000 UNIT) PO CAPS: 50000.0000 [IU] | ORAL_CAPSULE | ORAL | 0 refills | Status: AC

## 2022-02-15 ENCOUNTER — Other Ambulatory Visit: Payer: Self-pay | Admitting: *Deleted

## 2022-02-15 ENCOUNTER — Telehealth: Payer: Self-pay | Admitting: Internal Medicine

## 2022-02-15 DIAGNOSIS — E559 Vitamin D deficiency, unspecified: Secondary | ICD-10-CM

## 2022-02-15 DIAGNOSIS — R739 Hyperglycemia, unspecified: Secondary | ICD-10-CM

## 2022-02-15 NOTE — Telephone Encounter (Signed)
Pt called, returning CMA's call.   CMA was unavailable.   Pt asked that CMA call him back around 12:30 pm (he takes lunch at that time)

## 2022-02-15 NOTE — Telephone Encounter (Signed)
Reviewed lab results with patient.

## 2022-04-30 ENCOUNTER — Encounter: Payer: Self-pay | Admitting: Internal Medicine

## 2022-04-30 ENCOUNTER — Other Ambulatory Visit: Payer: Self-pay | Admitting: Internal Medicine

## 2022-04-30 DIAGNOSIS — N529 Male erectile dysfunction, unspecified: Secondary | ICD-10-CM

## 2022-04-30 MED ORDER — TADALAFIL 20 MG PO TABS: 20.0000 mg | ORAL_TABLET | Freq: Every day | ORAL | 0 refills | Status: DC | PRN

## 2022-05-30 ENCOUNTER — Other Ambulatory Visit: Payer: Self-pay | Admitting: Internal Medicine

## 2022-05-30 DIAGNOSIS — N529 Male erectile dysfunction, unspecified: Secondary | ICD-10-CM

## 2022-06-04 MED ORDER — TADALAFIL 20 MG PO TABS: 20.0000 mg | ORAL_TABLET | Freq: Every day | ORAL | 0 refills | Status: DC | PRN

## 2022-07-11 ENCOUNTER — Other Ambulatory Visit: Payer: Self-pay | Admitting: Internal Medicine

## 2022-07-11 DIAGNOSIS — N529 Male erectile dysfunction, unspecified: Secondary | ICD-10-CM

## 2022-07-12 MED ORDER — TADALAFIL 20 MG PO TABS: 20.0000 mg | ORAL_TABLET | Freq: Every day | ORAL | 2 refills | Status: DC | PRN

## 2022-10-26 ENCOUNTER — Other Ambulatory Visit: Payer: Self-pay | Admitting: Internal Medicine

## 2022-10-26 DIAGNOSIS — I10 Essential (primary) hypertension: Secondary | ICD-10-CM

## 2022-11-27 ENCOUNTER — Ambulatory Visit: Payer: BC Managed Care – PPO | Admitting: Internal Medicine

## 2022-11-29 ENCOUNTER — Ambulatory Visit: Payer: BC Managed Care – PPO | Admitting: Internal Medicine

## 2022-12-06 ENCOUNTER — Encounter: Payer: Self-pay | Admitting: Internal Medicine

## 2022-12-06 ENCOUNTER — Ambulatory Visit (INDEPENDENT_AMBULATORY_CARE_PROVIDER_SITE_OTHER): Payer: BC Managed Care – PPO | Admitting: Internal Medicine

## 2022-12-06 VITALS — BP 150/89 | HR 76 | Temp 98.2°F | Wt 188.8 lb

## 2022-12-06 DIAGNOSIS — I10 Essential (primary) hypertension: Secondary | ICD-10-CM | POA: Diagnosis not present

## 2022-12-06 DIAGNOSIS — D229 Melanocytic nevi, unspecified: Secondary | ICD-10-CM

## 2022-12-06 DIAGNOSIS — Z1283 Encounter for screening for malignant neoplasm of skin: Secondary | ICD-10-CM

## 2022-12-06 DIAGNOSIS — K279 Peptic ulcer, site unspecified, unspecified as acute or chronic, without hemorrhage or perforation: Secondary | ICD-10-CM

## 2022-12-06 DIAGNOSIS — Z720 Tobacco use: Secondary | ICD-10-CM

## 2022-12-06 DIAGNOSIS — Z1211 Encounter for screening for malignant neoplasm of colon: Secondary | ICD-10-CM

## 2022-12-06 DIAGNOSIS — E782 Mixed hyperlipidemia: Secondary | ICD-10-CM

## 2022-12-06 DIAGNOSIS — N529 Male erectile dysfunction, unspecified: Secondary | ICD-10-CM | POA: Diagnosis not present

## 2022-12-06 DIAGNOSIS — R7302 Impaired glucose tolerance (oral): Secondary | ICD-10-CM

## 2022-12-06 MED ORDER — TADALAFIL 20 MG PO TABS: 20.0000 mg | ORAL_TABLET | Freq: Every day | ORAL | 2 refills | Status: DC | PRN

## 2022-12-06 NOTE — Assessment & Plan Note (Signed)
Tadalafil refilled today.

## 2022-12-06 NOTE — Assessment & Plan Note (Addendum)
Blood pressure is elevated today but has been normal at previous visits.  He states he has been compliant with his amlodipine 5 mg and Diovan HCT 160/25 mg daily.  He will do ambulatory blood pressure monitoring and return in 3 months for follow-up.

## 2022-12-06 NOTE — Assessment & Plan Note (Signed)
Well-controlled on PPI therapy. ?

## 2022-12-06 NOTE — Assessment & Plan Note (Signed)
Last A1c was 6.3 

## 2022-12-06 NOTE — Assessment & Plan Note (Signed)
On atorvastatin 20 mg daily, last LDL was 89.

## 2022-12-06 NOTE — Progress Notes (Signed)
Established Patient Office Visit     CC/Reason for Visit: Follow-up chronic conditions  HPI: Christian Howard is a 62 y.o. male who is coming in today for the above mentioned reasons. Past Medical History is significant for: Hypertension, hyperlipidemia, impaired glucose tolerance, peptic ulcer disease/GERD, ongoing nicotine dependence, vitamin D D and B12 deficiencies.  He states he has been compliant with medication.  He is requesting referral to dermatology for mole in his right lower abdomen.   Past Medical/Surgical History: Past Medical History:  Diagnosis Date   Blood transfusion without reported diagnosis    states he had to have transfusion d/t gastric ulcer   Gastric ulcer with hemorrhage    Hypertension     No past surgical history on file.  Social History:  reports that he has been smoking cigarettes. He has a 43.00 pack-year smoking history. He uses smokeless tobacco. He reports current alcohol use. He reports that he does not use drugs.  Allergies: No Known Allergies  Family History:  Family History  Problem Relation Age of Onset   Cancer Sister      Current Outpatient Medications:    amLODipine (NORVASC) 5 MG tablet, Take 1 tablet by mouth once daily, Disp: 90 tablet, Rfl: 0   atorvastatin (LIPITOR) 20 MG tablet, Take 1 tablet (20 mg total) by mouth daily., Disp: 90 tablet, Rfl: 1   buPROPion (WELLBUTRIN XL) 150 MG 24 hr tablet, Take 1 tablet (150 mg total) by mouth daily., Disp: 90 tablet, Rfl: 1   ibuprofen (ADVIL) 400 MG tablet, Take 1 tablet (400 mg total) by mouth every 6 (six) hours as needed for moderate pain., Disp: 10 tablet, Rfl: 0   methocarbamol (ROBAXIN) 500 MG tablet, Take 1 tablet (500 mg total) by mouth every 8 (eight) hours as needed for muscle spasms., Disp: 8 tablet, Rfl: 0   ondansetron (ZOFRAN-ODT) 4 MG disintegrating tablet, 4mg  ODT q4 hours prn nausea/vomit, Disp: 20 tablet, Rfl: 0   pantoprazole (PROTONIX) 40 MG tablet, Take 1 tablet  (40 mg total) by mouth daily., Disp: 90 tablet, Rfl: 1   triamcinolone cream (KENALOG) 0.1 %, Apply 1 application. topically 2 (two) times daily., Disp: 30 g, Rfl: 0   valsartan-hydrochlorothiazide (DIOVAN-HCT) 160-25 MG tablet, Take 1 tablet by mouth daily., Disp: 90 tablet, Rfl: 1   tadalafil (CIALIS) 20 MG tablet, Take 1 tablet (20 mg total) by mouth daily as needed for erectile dysfunction., Disp: 10 tablet, Rfl: 2  Review of Systems:  Negative unless indicated in HPI.   Physical Exam: Vitals:   12/06/22 0854 12/06/22 0857  BP: (!) 140/90 (!) 150/89  Pulse: 76   Temp: 98.2 F (36.8 C)   TempSrc: Oral   SpO2: 98%   Weight: 188 lb 12.8 oz (85.6 kg)     Body mass index is 28.29 kg/m.   Physical Exam Vitals reviewed.  Constitutional:      Appearance: Normal appearance.  HENT:     Head: Normocephalic and atraumatic.  Eyes:     Conjunctiva/sclera: Conjunctivae normal.     Pupils: Pupils are equal, round, and reactive to light.  Cardiovascular:     Rate and Rhythm: Normal rate and regular rhythm.  Pulmonary:     Effort: Pulmonary effort is normal.     Breath sounds: Normal breath sounds.  Skin:    General: Skin is warm and dry.  Neurological:     General: No focal deficit present.     Mental Status: He is  alert and oriented to person, place, and time.  Psychiatric:        Mood and Affect: Mood normal.        Behavior: Behavior normal.        Thought Content: Thought content normal.        Judgment: Judgment normal.      Impression and Plan:  Colon cancer screening -     Ambulatory referral to Gastroenterology  Erectile dysfunction, unspecified erectile dysfunction type Assessment & Plan: Tadalafil refilled today.  Orders: -     Tadalafil; Take 1 tablet (20 mg total) by mouth daily as needed for erectile dysfunction.  Dispense: 10 tablet; Refill: 2  PUD (peptic ulcer disease) Assessment & Plan: Well-controlled on PPI therapy.   Essential  hypertension Assessment & Plan: Blood pressure is elevated today but has been normal at previous visits.  He states he has been compliant with his amlodipine 5 mg and Diovan HCT 160/25 mg daily.   IGT (impaired glucose tolerance) Assessment & Plan: Last A1c was 6.3.   Mixed hyperlipidemia Assessment & Plan: On atorvastatin 20 mg daily, last LDL was 89.   Tobacco abuse  Skin cancer screening -     Ambulatory referral to Dermatology  Atypical mole -     Ambulatory referral to Dermatology     Time spent:32 minutes reviewing chart, interviewing and examining patient and formulating plan of care.     Chaya Jan, MD  [facility] at [facility]

## 2023-01-07 ENCOUNTER — Encounter: Payer: Self-pay | Admitting: Gastroenterology

## 2023-02-04 ENCOUNTER — Other Ambulatory Visit: Payer: Self-pay | Admitting: Internal Medicine

## 2023-02-04 DIAGNOSIS — I10 Essential (primary) hypertension: Secondary | ICD-10-CM

## 2023-03-01 ENCOUNTER — Ambulatory Visit: Payer: BC Managed Care – PPO

## 2023-03-01 VITALS — Ht 68.0 in | Wt 180.0 lb

## 2023-03-01 DIAGNOSIS — Z1211 Encounter for screening for malignant neoplasm of colon: Secondary | ICD-10-CM

## 2023-03-01 MED ORDER — NA SULFATE-K SULFATE-MG SULF 17.5-3.13-1.6 GM/177ML PO SOLN: 1.0000 | Freq: Once | ORAL | 0 refills | Status: AC

## 2023-03-01 NOTE — Progress Notes (Signed)
Pre visit completed via phone call; Patient verified name, DOB, and address;  No egg or soy allergy known to patient;  No issues known to pt with past sedation with any surgeries or procedures; Patient denies ever being told they had issues or difficulty with intubation;  No FH of Malignant Hyperthermia; Pt is not on diet pills; Pt is not on home 02;  Pt is not on blood thinners;  Pt denies issues with constipation  No A fib or A flutter;  Have any cardiac testing pending--NO Insurance verified during PV appt--- BCBS  Pt can ambulate without assistance;  Pt denies use of chewing tobacco Discussed diabetic/weight loss medication holds; Discussed NSAID holds; Checked BMI to be less than 50; Pt instructed to use Single[employer]com or GoodRx for a price reduction on prep  Patient's chart reviewed by Cathlyn Parsons CNRA prior to previsit and patient appropriate for the [facility].  Pre visit completed and red dot placed by patient's name on their procedure day (on provider's schedule).    Instructions sent to patient's MyChart as well as printed and mailed to address listed in chart;

## 2023-03-21 ENCOUNTER — Encounter: Payer: BC Managed Care – PPO | Admitting: Internal Medicine

## 2023-03-22 ENCOUNTER — Ambulatory Visit (AMBULATORY_SURGERY_CENTER): Payer: BC Managed Care – PPO | Admitting: Gastroenterology

## 2023-03-22 ENCOUNTER — Encounter: Payer: Self-pay | Admitting: Gastroenterology

## 2023-03-22 VITALS — BP 119/68 | HR 80 | Temp 97.1°F | Resp 18 | Ht 68.0 in | Wt 180.0 lb

## 2023-03-22 DIAGNOSIS — D125 Benign neoplasm of sigmoid colon: Secondary | ICD-10-CM | POA: Diagnosis not present

## 2023-03-22 DIAGNOSIS — D123 Benign neoplasm of transverse colon: Secondary | ICD-10-CM

## 2023-03-22 DIAGNOSIS — Z1211 Encounter for screening for malignant neoplasm of colon: Secondary | ICD-10-CM | POA: Diagnosis not present

## 2023-03-22 DIAGNOSIS — D122 Benign neoplasm of ascending colon: Secondary | ICD-10-CM | POA: Diagnosis not present

## 2023-03-22 DIAGNOSIS — K635 Polyp of colon: Secondary | ICD-10-CM | POA: Diagnosis not present

## 2023-03-22 MED ORDER — SODIUM CHLORIDE 0.9 % IV SOLN: 500.0000 mL | Freq: Once | INTRAVENOUS | Status: DC

## 2023-03-22 NOTE — Progress Notes (Signed)
Called to room to assist during endoscopic procedure.  Patient ID and intended procedure confirmed with present staff. Received instructions for my participation in the procedure from the performing physician.  

## 2023-03-22 NOTE — Patient Instructions (Signed)
Discharge instructions given. Handouts on polyps and Hemorrhoids. Resume previous medications. Use Fiber Con 1-02 tabs by mouth everyday. YOU HAD AN ENDOSCOPIC PROCEDURE TODAY AT THE  ENDOSCOPY CENTER:   Refer to the procedure report that was given to you for any specific questions about what was found during the examination.  If the procedure report does not answer your questions, please call your gastroenterologist to clarify.  If you requested that your care partner not be given the details of your procedure findings, then the procedure report has been included in a sealed envelope for you to review at your convenience later.  YOU SHOULD EXPECT: Some feelings of bloating in the abdomen. Passage of more gas than usual.  Walking can help get rid of the air that was put into your GI tract during the procedure and reduce the bloating. If you had a lower endoscopy (such as a colonoscopy or flexible sigmoidoscopy) you may notice spotting of blood in your stool or on the toilet paper. If you underwent a bowel prep for your procedure, you may not have a normal bowel movement for a few days.  Please Note:  You might notice some irritation and congestion in your nose or some drainage.  This is from the oxygen used during your procedure.  There is no need for concern and it should clear up in a day or so.  SYMPTOMS TO REPORT IMMEDIATELY:  Following lower endoscopy (colonoscopy or flexible sigmoidoscopy):  Excessive amounts of blood in the stool  Significant tenderness or worsening of abdominal pains  Swelling of the abdomen that is new, acute  Fever of 100F or higher   For urgent or emergent issues, a gastroenterologist can be reached at any hour by calling [phone]. Do not use MyChart messaging for urgent concerns.    DIET:  We do recommend a small meal at first, but then you may proceed to your regular diet.  Drink plenty of fluids but you should avoid alcoholic beverages for 24  hours.  ACTIVITY:  You should plan to take it easy for the rest of today and you should NOT DRIVE or use heavy machinery until tomorrow (because of the sedation medicines used during the test).    FOLLOW UP: Our staff will call the number listed on your records the next business day following your procedure.  We will call around 7:15- 8:00 am to check on you and address any questions or concerns that you may have regarding the information given to you following your procedure. If we do not reach you, we will leave a message.     If any biopsies were taken you will be contacted by phone or by letter within the next 1-3 weeks.  Please call us at [phone] if you have not heard about the biopsies in 3 weeks.    SIGNATURES/CONFIDENTIALITY: You and/or your care partner have signed paperwork which will be entered into your electronic medical record.  These signatures attest to the fact that that the information above on your After Visit Summary has been reviewed and is understood.  Full responsibility of the confidentiality of this discharge information lies with you and/or your care-partner.

## 2023-03-22 NOTE — Op Note (Signed)
[facility] Patient Name: Christian Howard Procedure Date: 03/22/2023 1:18 PM MRN: [id number] Endoscopist: Corliss Parish , MD, [id number] Age: 62 Referring MD:  Date of Birth: January 16, 1961 Gender: Male Account #: [id number] Procedure:                Colonoscopy Indications:              Screening for colorectal malignant neoplasm Medicines:                Monitored Anesthesia Care Procedure:                Pre-Anesthesia Assessment:                           - Prior to the procedure, a History and Physical                            was performed, and patient medications and                            allergies were reviewed. The patient's tolerance of                            previous anesthesia was also reviewed. The risks                            and benefits of the procedure and the sedation                            options and risks were discussed with the patient.                            All questions were answered, and informed consent                            was obtained. Prior Anticoagulants: The patient has                            taken no anticoagulant or antiplatelet agents. ASA                            Grade Assessment: II - A patient with mild systemic                            disease. After reviewing the risks and benefits,                            the patient was deemed in satisfactory condition to                            undergo the procedure.                           After obtaining informed consent, the colonoscope  was passed under direct vision. Throughout the                            procedure, the patient's blood pressure, pulse, and                            oxygen saturations were monitored continuously. The                            Olympus Scope SN: [id number] was introduced through                            the anus and advanced to the the cecum, identified                            by  appendiceal orifice and ileocecal valve. The                            colonoscopy was performed without difficulty. The                            patient tolerated the procedure. The quality of the                            bowel preparation was good. The ileocecal valve,                            appendiceal orifice, and rectum were photographed. Scope In: 1:36:51 PM Scope Out: 1:52:01 PM Scope Withdrawal Time: 0 hours 12 minutes 58 seconds  Total Procedure Duration: 0 hours 15 minutes 10 seconds  Findings:                 Skin tags were found on perianal exam.                           The digital rectal exam findings include                            hemorrhoids. Pertinent negatives include no                            palpable rectal lesions.                           Seven sessile polyps were found in the sigmoid                            colon (2), descending colon (2), transverse colon                            (2), and ascending colon (1). The polyps were 1 to                            5 mm in size. These polyps were removed  with a cold                            snare. Resection and retrieval were complete.                           Normal mucosa was found in the entire colon                            otherwise.                           Non-bleeding non-thrombosed internal hemorrhoids                            were found during retroflexion, during perianal                            exam and during digital exam. The hemorrhoids were                            Grade II (internal hemorrhoids that prolapse but                            reduce spontaneously). Complications:            No immediate complications. Estimated Blood Loss:     Estimated blood loss was minimal. Impression:               - Perianal skin tags found on perianal exam.                           - Hemorrhoids found on digital rectal exam.                           - Seven, 1 to 5 mm polyps in the  sigmoid colon, in                            the descending colon, in the transverse colon and                            in the ascending colon, removed with a cold snare.                            Resected and retrieved.                           - Normal mucosa in the entire examined colon                            otherwise.                           - Non-bleeding non-thrombosed internal hemorrhoids. Recommendation:           - The patient will be observed post-procedure,  until all discharge criteria are met.                           - Discharge patient to home.                           - Patient has a contact number available for                            emergencies. The signs and symptoms of potential                            delayed complications were discussed with the                            patient. Return to normal activities tomorrow.                            Written discharge instructions were provided to the                            patient.                           - High fiber diet.                           - Use FiberCon 1-2 tablets PO daily.                           - Continue present medications.                           - Await pathology results.                           - Repeat colonoscopy in 3/5/7 years for                            surveillance based on pathology results.                           - The findings and recommendations were discussed                            with the patient.                           - The findings and recommendations were discussed                            with the patient's family. Corliss Parish, MD 03/22/2023 1:58:12 PM

## 2023-03-22 NOTE — Progress Notes (Unsigned)
Sedate, gd SR, tolerated procedure well, VSS, report to RN 

## 2023-03-22 NOTE — Progress Notes (Unsigned)
GASTROENTEROLOGY PROCEDURE H&P NOTE   Primary Care Physician: Philip Aspen, Limmie Patricia, MD  HPI: Christian Howard is a 62 y.o. male who presents for Colonoscopy for screening.  Past Medical History:  Diagnosis Date   Blood transfusion without reported diagnosis 2001   states he had to have transfusion d/t gastric ulcer   Gastric ulcer with hemorrhage    Hypertension    Past Surgical History:  Procedure Laterality Date   WRIST SURGERY Right 1989   Current Outpatient Medications  Medication Sig Dispense Refill   amLODipine (NORVASC) 5 MG tablet Take 1 tablet by mouth once daily 90 tablet 0   tadalafil (CIALIS) 20 MG tablet Take 1 tablet (20 mg total) by mouth daily as needed for erectile dysfunction. 10 tablet 2   atorvastatin (LIPITOR) 20 MG tablet Take 1 tablet (20 mg total) by mouth daily. (Patient not taking: Reported on 03/01/2023) 90 tablet 1   buPROPion (WELLBUTRIN XL) 150 MG 24 hr tablet Take 1 tablet (150 mg total) by mouth daily. (Patient not taking: Reported on 03/01/2023) 90 tablet 1   ibuprofen (ADVIL) 400 MG tablet Take 1 tablet (400 mg total) by mouth every 6 (six) hours as needed for moderate pain. (Patient not taking: Reported on 03/01/2023) 10 tablet 0   methocarbamol (ROBAXIN) 500 MG tablet Take 1 tablet (500 mg total) by mouth every 8 (eight) hours as needed for muscle spasms. (Patient not taking: Reported on 03/01/2023) 8 tablet 0   ondansetron (ZOFRAN-ODT) 4 MG disintegrating tablet 4mg  ODT q4 hours prn nausea/vomit (Patient not taking: Reported on 03/01/2023) 20 tablet 0   pantoprazole (PROTONIX) 40 MG tablet Take 1 tablet (40 mg total) by mouth daily. (Patient not taking: Reported on 03/01/2023) 90 tablet 1   triamcinolone cream (KENALOG) 0.1 % Apply 1 application. topically 2 (two) times daily. (Patient not taking: Reported on 03/01/2023) 30 g 0   valsartan-hydrochlorothiazide (DIOVAN-HCT) 160-25 MG tablet Take 1 tablet by mouth daily. (Patient not taking: Reported on  03/01/2023) 90 tablet 1   Current Facility-Administered Medications  Medication Dose Route Frequency Provider Last Rate Last Admin   0.9 %  sodium chloride infusion  500 mL Intravenous Once Mansouraty, Netty Starring., MD        Current Outpatient Medications:    amLODipine (NORVASC) 5 MG tablet, Take 1 tablet by mouth once daily, Disp: 90 tablet, Rfl: 0   tadalafil (CIALIS) 20 MG tablet, Take 1 tablet (20 mg total) by mouth daily as needed for erectile dysfunction., Disp: 10 tablet, Rfl: 2   atorvastatin (LIPITOR) 20 MG tablet, Take 1 tablet (20 mg total) by mouth daily. (Patient not taking: Reported on 03/01/2023), Disp: 90 tablet, Rfl: 1   buPROPion (WELLBUTRIN XL) 150 MG 24 hr tablet, Take 1 tablet (150 mg total) by mouth daily. (Patient not taking: Reported on 03/01/2023), Disp: 90 tablet, Rfl: 1   ibuprofen (ADVIL) 400 MG tablet, Take 1 tablet (400 mg total) by mouth every 6 (six) hours as needed for moderate pain. (Patient not taking: Reported on 03/01/2023), Disp: 10 tablet, Rfl: 0   methocarbamol (ROBAXIN) 500 MG tablet, Take 1 tablet (500 mg total) by mouth every 8 (eight) hours as needed for muscle spasms. (Patient not taking: Reported on 03/01/2023), Disp: 8 tablet, Rfl: 0   ondansetron (ZOFRAN-ODT) 4 MG disintegrating tablet, 4mg  ODT q4 hours prn nausea/vomit (Patient not taking: Reported on 03/01/2023), Disp: 20 tablet, Rfl: 0   pantoprazole (PROTONIX) 40 MG tablet, Take 1 tablet (40 mg total)  by mouth daily. (Patient not taking: Reported on 03/01/2023), Disp: 90 tablet, Rfl: 1   triamcinolone cream (KENALOG) 0.1 %, Apply 1 application. topically 2 (two) times daily. (Patient not taking: Reported on 03/01/2023), Disp: 30 g, Rfl: 0   valsartan-hydrochlorothiazide (DIOVAN-HCT) 160-25 MG tablet, Take 1 tablet by mouth daily. (Patient not taking: Reported on 03/01/2023), Disp: 90 tablet, Rfl: 1  Current Facility-Administered Medications:    0.9 %  sodium chloride infusion, 500 mL, Intravenous, Once,  Mansouraty, Netty Starring., MD No Known Allergies Family History  Problem Relation Age of Onset   Colon polyps Neg Hx    Colon cancer Neg Hx    Esophageal cancer Neg Hx    Stomach cancer Neg Hx    Rectal cancer Neg Hx    Social History   Socioeconomic History   Marital status: Single    Spouse name: Not on file   Number of children: Not on file   Years of education: Not on file   Highest education level: Not on file  Occupational History   Not on file  Tobacco Use   Smoking status: Every Day    Current packs/day: 1.00    Average packs/day: 1 pack/day for 43.0 years (43.0 ttl pk-yrs)    Types: Cigarettes   Smokeless tobacco: Current  Vaping Use   Vaping status: Never Used  Substance and Sexual Activity   Alcohol use: Yes    Comment: occasional   Drug use: No   Sexual activity: Yes  Other Topics Concern   Not on file  Social History Narrative   Not on file   Social Determinants of Health   Financial Resource Strain: Not on file  Food Insecurity: Not on file  Transportation Needs: Not on file  Physical Activity: Not on file  Stress: Not on file  Social Connections: Not on file  Intimate Partner Violence: Not on file    Physical Exam: Today's Vitals   03/22/23 1323 03/22/23 1324 03/22/23 1325 03/22/23 1330  BP:      Pulse:      Resp: (!) 23 (!) 24 (!) 23 (!) 22  Temp:      TempSrc:      SpO2: 100% 100% 100% 100%  Weight:      Height:       Body mass index is 27.37 kg/m. GEN: NAD EYE: Sclerae anicteric ENT: MMM CV: Non-tachycardic GI: Soft, NT/ND NEURO:  Alert & Oriented x 3  Lab Results: No results for input(s): "WBC", "HGB", "HCT", "PLT" in the last 72 hours. BMET No results for input(s): "NA", "K", "CL", "CO2", "GLUCOSE", "BUN", "CREATININE", "CALCIUM" in the last 72 hours. LFT No results for input(s): "PROT", "ALBUMIN", "AST", "ALT", "ALKPHOS", "BILITOT", "BILIDIR", "IBILI" in the last 72 hours. PT/INR No results for input(s): "LABPROT", "INR"  in the last 72 hours.   Impression / Plan: This is a 62 y.o.male who presents for Colonoscopy for screening.  The risks and benefits of endoscopic evaluation/treatment were discussed with the patient and/or family; these include but are not limited to the risk of perforation, infection, bleeding, missed lesions, lack of diagnosis, severe illness requiring hospitalization, as well as anesthesia and sedation related illnesses.  The patient's history has been reviewed, patient examined, no change in status, and deemed stable for procedure.  The patient and/or family is agreeable to proceed.    Corliss Parish, MD [facility] Office # [phone]

## 2023-03-22 NOTE — Progress Notes (Unsigned)
Pt's states no medical or surgical changes since previsit or office visit. 

## 2023-03-25 ENCOUNTER — Telehealth: Payer: Self-pay | Admitting: *Deleted

## 2023-03-25 NOTE — Telephone Encounter (Signed)
No answer for post procedure call back. Left VM. 

## 2023-03-27 LAB — SURGICAL PATHOLOGY

## 2023-03-28 ENCOUNTER — Encounter: Payer: Self-pay | Admitting: Gastroenterology

## 2023-04-11 ENCOUNTER — Ambulatory Visit (INDEPENDENT_AMBULATORY_CARE_PROVIDER_SITE_OTHER): Payer: BC Managed Care – PPO | Admitting: Internal Medicine

## 2023-04-11 ENCOUNTER — Encounter: Payer: Self-pay | Admitting: Internal Medicine

## 2023-04-11 VITALS — BP 130/80 | HR 90 | Temp 98.7°F | Ht 68.0 in | Wt 193.4 lb

## 2023-04-11 DIAGNOSIS — Z122 Encounter for screening for malignant neoplasm of respiratory organs: Secondary | ICD-10-CM

## 2023-04-11 DIAGNOSIS — Z1159 Encounter for screening for other viral diseases: Secondary | ICD-10-CM | POA: Diagnosis not present

## 2023-04-11 DIAGNOSIS — E782 Mixed hyperlipidemia: Secondary | ICD-10-CM

## 2023-04-11 DIAGNOSIS — R7302 Impaired glucose tolerance (oral): Secondary | ICD-10-CM

## 2023-04-11 DIAGNOSIS — E559 Vitamin D deficiency, unspecified: Secondary | ICD-10-CM

## 2023-04-11 DIAGNOSIS — Z0000 Encounter for general adult medical examination without abnormal findings: Secondary | ICD-10-CM

## 2023-04-11 DIAGNOSIS — Z23 Encounter for immunization: Secondary | ICD-10-CM | POA: Diagnosis not present

## 2023-04-11 DIAGNOSIS — E538 Deficiency of other specified B group vitamins: Secondary | ICD-10-CM

## 2023-04-11 DIAGNOSIS — Z125 Encounter for screening for malignant neoplasm of prostate: Secondary | ICD-10-CM | POA: Diagnosis not present

## 2023-04-11 DIAGNOSIS — I10 Essential (primary) hypertension: Secondary | ICD-10-CM | POA: Diagnosis not present

## 2023-04-11 DIAGNOSIS — Z114 Encounter for screening for human immunodeficiency virus [HIV]: Secondary | ICD-10-CM

## 2023-04-11 DIAGNOSIS — N529 Male erectile dysfunction, unspecified: Secondary | ICD-10-CM

## 2023-04-11 DIAGNOSIS — K279 Peptic ulcer, site unspecified, unspecified as acute or chronic, without hemorrhage or perforation: Secondary | ICD-10-CM

## 2023-04-11 DIAGNOSIS — Z720 Tobacco use: Secondary | ICD-10-CM

## 2023-04-11 MED ORDER — VALSARTAN-HYDROCHLOROTHIAZIDE 160-25 MG PO TABS: 1.0000 | ORAL_TABLET | Freq: Every day | ORAL | 1 refills | Status: DC

## 2023-04-11 MED ORDER — PANTOPRAZOLE SODIUM 40 MG PO TBEC: 40.0000 mg | DELAYED_RELEASE_TABLET | Freq: Every day | ORAL | 1 refills | Status: DC

## 2023-04-11 MED ORDER — ATORVASTATIN CALCIUM 20 MG PO TABS: 20.0000 mg | ORAL_TABLET | Freq: Every day | ORAL | 1 refills | Status: DC

## 2023-04-11 MED ORDER — BUPROPION HCL ER (XL) 150 MG PO TB24: 150.0000 mg | ORAL_TABLET | Freq: Every day | ORAL | 1 refills | Status: DC

## 2023-04-11 MED ORDER — TADALAFIL 20 MG PO TABS: 20.0000 mg | ORAL_TABLET | Freq: Every day | ORAL | 2 refills | Status: DC | PRN

## 2023-04-11 MED ORDER — AMLODIPINE BESYLATE 5 MG PO TABS: 5.0000 mg | ORAL_TABLET | Freq: Every day | ORAL | 1 refills | Status: DC

## 2023-04-11 NOTE — Addendum Note (Signed)
Addended by: Kern Reap B on: 04/11/2023 04:48 PM   Modules accepted: Orders

## 2023-04-11 NOTE — Progress Notes (Signed)
Established Patient Office Visit     CC/Reason for Visit: Annual preventive exam  HPI: Christian Howard is a 62 y.o. male who is coming in today for the above mentioned reasons. Past Medical History is significant for: Hypertension, hyperlipidemia, impaired glucose tolerance, history of peptic ulcer disease/GERD, ongoing nicotine dependence, vitamin D and B12 deficiencies.  Has been feeling well.  He is not taking any medications other than amlodipine and Cialis for reasons that are unclear to me.  He states that when he last went to the pharmacy to pick up medications these were the only refills available to him.  He did not think to question this.  Requesting flu vaccine.  Has routine eye and dental care.   Past Medical/Surgical History: Past Medical History:  Diagnosis Date   Blood transfusion without reported diagnosis 2001   states he had to have transfusion d/t gastric ulcer   Gastric ulcer with hemorrhage    Hypertension     Past Surgical History:  Procedure Laterality Date   WRIST SURGERY Right 1989    Social History:  reports that he has been smoking cigarettes. He has a 43 pack-year smoking history. He uses smokeless tobacco. He reports current alcohol use. He reports that he does not use drugs.  Allergies: No Known Allergies  Family History:  Family History  Problem Relation Age of Onset   Colon polyps Neg Hx    Colon cancer Neg Hx    Esophageal cancer Neg Hx    Stomach cancer Neg Hx    Rectal cancer Neg Hx      Current Outpatient Medications:    amLODipine (NORVASC) 5 MG tablet, Take 1 tablet (5 mg total) by mouth daily., Disp: 90 tablet, Rfl: 1   atorvastatin (LIPITOR) 20 MG tablet, Take 1 tablet (20 mg total) by mouth daily., Disp: 90 tablet, Rfl: 1   buPROPion (WELLBUTRIN XL) 150 MG 24 hr tablet, Take 1 tablet (150 mg total) by mouth daily., Disp: 90 tablet, Rfl: 1   pantoprazole (PROTONIX) 40 MG tablet, Take 1 tablet (40 mg total) by mouth daily.,  Disp: 90 tablet, Rfl: 1   tadalafil (CIALIS) 20 MG tablet, Take 1 tablet (20 mg total) by mouth daily as needed for erectile dysfunction., Disp: 10 tablet, Rfl: 2   valsartan-hydrochlorothiazide (DIOVAN-HCT) 160-25 MG tablet, Take 1 tablet by mouth daily., Disp: 90 tablet, Rfl: 1  Review of Systems:  Negative unless indicated in HPI.   Physical Exam: Vitals:   04/11/23 1517  BP: 130/80  Pulse: 90  Temp: 98.7 F (37.1 C)  TempSrc: Oral  SpO2: 99%  Weight: 193 lb 6.4 oz (87.7 kg)  Height: 5\' 8"  (1.727 m)    Body mass index is 29.41 kg/m.   Physical Exam Vitals reviewed.  Constitutional:      General: He is not in acute distress.    Appearance: Normal appearance. He is not ill-appearing, toxic-appearing or diaphoretic.  HENT:     Head: Normocephalic.     Right Ear: Tympanic membrane, ear canal and external ear normal. There is no impacted cerumen.     Left Ear: Tympanic membrane, ear canal and external ear normal. There is no impacted cerumen.     Nose: Nose normal.     Mouth/Throat:     Mouth: Mucous membranes are moist.     Pharynx: Oropharynx is clear. No oropharyngeal exudate or posterior oropharyngeal erythema.  Eyes:     General: No scleral icterus.  Right eye: No discharge.        Left eye: No discharge.     Conjunctiva/sclera: Conjunctivae normal.     Pupils: Pupils are equal, round, and reactive to light.  Neck:     Vascular: No carotid bruit.  Cardiovascular:     Rate and Rhythm: Normal rate and regular rhythm.     Pulses: Normal pulses.     Heart sounds: Normal heart sounds.  Pulmonary:     Effort: Pulmonary effort is normal. No respiratory distress.     Breath sounds: Normal breath sounds.  Abdominal:     General: Abdomen is flat. Bowel sounds are normal.     Palpations: Abdomen is soft.  Musculoskeletal:        General: Normal range of motion.     Cervical back: Normal range of motion.  Skin:    General: Skin is warm and dry.  Neurological:      General: No focal deficit present.     Mental Status: He is alert and oriented to person, place, and time. Mental status is at baseline.  Psychiatric:        Mood and Affect: Mood normal.        Behavior: Behavior normal.        Thought Content: Thought content normal.        Judgment: Judgment normal.     Flowsheet Row Office Visit from 04/11/2023 in [facility]  PHQ-9 Total Score 0        Impression and Plan:  Encounter for preventive health examination  Essential hypertension -     CBC with Differential/Platelet; Future -     Comprehensive metabolic panel; Future -     Valsartan-hydroCHLOROthiazide; Take 1 tablet by mouth daily.  Dispense: 90 tablet; Refill: 1  IGT (impaired glucose tolerance) -     Hemoglobin A1c; Future  Mixed hyperlipidemia -     Lipid panel; Future -     Atorvastatin Calcium; Take 1 tablet (20 mg total) by mouth daily.  Dispense: 90 tablet; Refill: 1  Vitamin D deficiency -     VITAMIN D 25 Hydroxy (Vit-D Deficiency, Fractures); Future  Vitamin B12 deficiency -     Vitamin B12; Future  Immunization due  Encounter for hepatitis C screening test for low risk patient -     Hepatitis C antibody; Future  Encounter for screening for HIV -     HIV Antibody (routine testing w rflx); Future  Screening for lung cancer  Prostate cancer screening -     PSA; Future  Tobacco abuse -     Ambulatory Referral for Lung Cancer Scre -     buPROPion HCl ER (XL); Take 1 tablet (150 mg total) by mouth daily.  Dispense: 90 tablet; Refill: 1  Primary hypertension -     amLODIPine Besylate; Take 1 tablet (5 mg total) by mouth daily.  Dispense: 90 tablet; Refill: 1  PUD (peptic ulcer disease) -     Pantoprazole Sodium; Take 1 tablet (40 mg total) by mouth daily.  Dispense: 90 tablet; Refill: 1  Erectile dysfunction, unspecified erectile dysfunction type -     Tadalafil; Take 1 tablet (20 mg total) by mouth daily as  needed for erectile dysfunction.  Dispense: 10 tablet; Refill: 2   -Recommend routine eye and dental care. -Healthy lifestyle discussed in detail. -Labs to be updated today. -Prostate cancer screening: PSA today Health Maintenance  Topic Date Due   HIV Screening  Never done   Hepatitis C Screening  Never done   Screening for Lung Cancer  Never done   Flu Shot  01/17/2023   COVID-19 Vaccine (1 - 2023-24 season) Never done   Colon Cancer Screening  03/21/2026   DTaP/Tdap/Td vaccine (2 - Td or Tdap) 02/18/2029   Zoster (Shingles) Vaccine  Completed   HPV Vaccine  Aged Out     -Flu vaccine in office today. -He will take his medication list home and make sure that he is compliant with everything on his list.  All refills have been sent. -Referral for lung cancer screening.  He continues to smoke a little under a pack a day.  Not interested in discussing cessation at this time.     Chaya Jan, MD [facility]

## 2023-04-11 NOTE — Patient Instructions (Signed)
-  Nice seeing you today!!  -Lab work today; will notify you once results are available.  -Make sure your medications are correct according to this list.  -See you back in 3-4 months.

## 2023-04-12 LAB — CBC WITH DIFFERENTIAL/PLATELET
Basophils Absolute: 0 10*3/uL (ref 0.0–0.1)
Basophils Relative: 0.7 % (ref 0.0–3.0)
Eosinophils Absolute: 0.1 10*3/uL (ref 0.0–0.7)
Eosinophils Relative: 1.5 % (ref 0.0–5.0)
HCT: 50.5 % (ref 39.0–52.0)
Hemoglobin: 16.5 g/dL (ref 13.0–17.0)
Lymphocytes Relative: 31.9 % (ref 12.0–46.0)
Lymphs Abs: 2.4 10*3/uL (ref 0.7–4.0)
MCHC: 32.7 g/dL (ref 30.0–36.0)
MCV: 92.6 fL (ref 78.0–100.0)
Monocytes Absolute: 0.6 10*3/uL (ref 0.1–1.0)
Monocytes Relative: 7.7 % (ref 3.0–12.0)
Neutro Abs: 4.3 10*3/uL (ref 1.4–7.7)
Neutrophils Relative %: 58.2 % (ref 43.0–77.0)
Platelets: 270 10*3/uL (ref 150.0–400.0)
RBC: 5.45 Mil/uL (ref 4.22–5.81)
RDW: 13.7 % (ref 11.5–15.5)
WBC: 7.5 10*3/uL (ref 4.0–10.5)

## 2023-04-12 LAB — COMPREHENSIVE METABOLIC PANEL
ALT: 17 U/L (ref 0–53)
AST: 23 U/L (ref 0–37)
Albumin: 4.2 g/dL (ref 3.5–5.2)
Alkaline Phosphatase: 77 U/L (ref 39–117)
BUN: 12 mg/dL (ref 6–23)
CO2: 23 meq/L (ref 19–32)
Calcium: 9.2 mg/dL (ref 8.4–10.5)
Chloride: 103 meq/L (ref 96–112)
Creatinine, Ser: 1.08 mg/dL (ref 0.40–1.50)
GFR: 72.99 mL/min (ref >60.00)
Glucose, Bld: 85 mg/dL (ref 70–99)
Potassium: 3.9 meq/L (ref 3.5–5.1)
Sodium: 135 meq/L (ref 135–145)
Total Bilirubin: 1 mg/dL (ref 0.2–1.2)
Total Protein: 7.2 g/dL (ref 6.0–8.3)

## 2023-04-12 LAB — HEMOGLOBIN A1C: Hgb A1c MFr Bld: 5.7 % (ref 4.6–6.5)

## 2023-04-12 LAB — LIPID PANEL
Cholesterol: 193 mg/dL (ref 0–200)
HDL: 41.4 mg/dL (ref >39.00)
LDL Cholesterol: 132 mg/dL — ABNORMAL HIGH (ref 0–99)
NonHDL: 151.48
Total CHOL/HDL Ratio: 5
Triglycerides: 97 mg/dL (ref 0.0–149.0)
VLDL: 19.4 mg/dL (ref 0.0–40.0)

## 2023-04-12 LAB — PSA: PSA: 0.47 ng/mL (ref 0.10–4.00)

## 2023-04-12 LAB — VITAMIN D 25 HYDROXY (VIT D DEFICIENCY, FRACTURES): VITD: 18.6 ng/mL — ABNORMAL LOW (ref 30.00–100.00)

## 2023-04-12 LAB — VITAMIN B12: Vitamin B-12: 205 pg/mL — ABNORMAL LOW (ref 211–911)

## 2023-04-14 LAB — HIV ANTIBODY (ROUTINE TESTING W REFLEX): HIV 1&2 Ab, 4th Generation: NONREACTIVE

## 2023-04-14 LAB — HCV RNA,QUANTITATIVE REAL TIME PCR
HCV Quantitative Log: <1.18 {Log_IU}/mL
HCV RNA, PCR, QN: <15 [IU]/mL

## 2023-04-14 LAB — HEPATITIS C ANTIBODY: Hepatitis C Ab: REACTIVE — AB

## 2023-04-17 ENCOUNTER — Other Ambulatory Visit: Payer: Self-pay | Admitting: Internal Medicine

## 2023-04-17 ENCOUNTER — Encounter: Payer: Self-pay | Admitting: Internal Medicine

## 2023-04-17 DIAGNOSIS — E559 Vitamin D deficiency, unspecified: Secondary | ICD-10-CM

## 2023-04-17 DIAGNOSIS — R768 Other specified abnormal immunological findings in serum: Secondary | ICD-10-CM | POA: Insufficient documentation

## 2023-04-17 MED ORDER — VITAMIN D (ERGOCALCIFEROL) 1.25 MG (50000 UNIT) PO CAPS: 50000.0000 [IU] | ORAL_CAPSULE | ORAL | 0 refills | Status: AC

## 2023-04-18 ENCOUNTER — Telehealth: Payer: Self-pay | Admitting: Internal Medicine

## 2023-04-18 NOTE — Telephone Encounter (Signed)
Reviewed lab results with patient.

## 2023-04-18 NOTE — Telephone Encounter (Signed)
Pt called, returning CMA's call regarding lab results. CMA was with a Pt. Pt asked that CMA call back at her earliest convenience.

## 2023-04-25 ENCOUNTER — Ambulatory Visit: Payer: BC Managed Care – PPO | Admitting: *Deleted

## 2023-04-25 DIAGNOSIS — E538 Deficiency of other specified B group vitamins: Secondary | ICD-10-CM | POA: Diagnosis not present

## 2023-04-25 MED ORDER — CYANOCOBALAMIN 1000 MCG/ML IJ SOLN
1000.0000 ug | Freq: Once | INTRAMUSCULAR | Status: AC
  Administered 2023-04-25: 1000 ug via INTRAMUSCULAR

## 2023-04-25 NOTE — Progress Notes (Signed)
Per orders of Dr. Hernandez, injection of b12 given by Chetan Mehring. Patient tolerated injection well.  

## 2023-05-02 ENCOUNTER — Ambulatory Visit: Payer: BC Managed Care – PPO | Admitting: *Deleted

## 2023-05-02 DIAGNOSIS — E538 Deficiency of other specified B group vitamins: Secondary | ICD-10-CM

## 2023-05-02 MED ORDER — CYANOCOBALAMIN 1000 MCG/ML IJ SOLN
1000.0000 ug | Freq: Once | INTRAMUSCULAR | Status: AC
  Administered 2023-05-02: 1000 ug via INTRAMUSCULAR

## 2023-05-02 NOTE — Progress Notes (Signed)
Per orders of Dr. Hernandez, injection of b12 given by Chetan Mehring. Patient tolerated injection well.  

## 2023-05-09 ENCOUNTER — Ambulatory Visit: Payer: BC Managed Care – PPO | Admitting: *Deleted

## 2023-05-09 DIAGNOSIS — E538 Deficiency of other specified B group vitamins: Secondary | ICD-10-CM

## 2023-05-09 MED ORDER — CYANOCOBALAMIN 1000 MCG/ML IJ SOLN
1000.0000 ug | Freq: Once | INTRAMUSCULAR | Status: AC
  Administered 2023-05-09: 1000 ug via INTRAMUSCULAR

## 2023-05-09 NOTE — Progress Notes (Signed)
Per orders of Dr. Hernandez, injection of B12 given by Dailyn Reith. Patient tolerated injection well.  

## 2023-05-14 ENCOUNTER — Encounter: Payer: Self-pay | Admitting: Internal Medicine

## 2023-05-14 DIAGNOSIS — N529 Male erectile dysfunction, unspecified: Secondary | ICD-10-CM

## 2023-05-14 MED ORDER — TADALAFIL 20 MG PO TABS: 20.0000 mg | ORAL_TABLET | Freq: Every day | ORAL | 2 refills | Status: DC | PRN

## 2023-05-23 ENCOUNTER — Ambulatory Visit: Payer: BC Managed Care – PPO | Admitting: *Deleted

## 2023-05-23 DIAGNOSIS — E538 Deficiency of other specified B group vitamins: Secondary | ICD-10-CM

## 2023-05-23 MED ORDER — CYANOCOBALAMIN 1000 MCG/ML IJ SOLN
1000.0000 ug | Freq: Once | INTRAMUSCULAR | Status: AC
  Administered 2023-05-23: 1000 ug via INTRAMUSCULAR

## 2023-05-23 NOTE — Progress Notes (Signed)
Per orders of Dr. Hernandez, injection of b12 given by Chetan Mehring. Patient tolerated injection well.  

## 2023-07-04 ENCOUNTER — Ambulatory Visit: Payer: BC Managed Care – PPO | Admitting: Internal Medicine

## 2023-07-04 ENCOUNTER — Encounter: Payer: Self-pay | Admitting: Internal Medicine

## 2023-07-04 ENCOUNTER — Telehealth: Payer: Self-pay | Admitting: Acute Care

## 2023-07-04 ENCOUNTER — Other Ambulatory Visit: Payer: Self-pay

## 2023-07-04 VITALS — BP 120/70 | HR 76 | Temp 97.8°F | Wt 198.8 lb

## 2023-07-04 DIAGNOSIS — N529 Male erectile dysfunction, unspecified: Secondary | ICD-10-CM | POA: Diagnosis not present

## 2023-07-04 DIAGNOSIS — Z87891 Personal history of nicotine dependence: Secondary | ICD-10-CM

## 2023-07-04 DIAGNOSIS — R7302 Impaired glucose tolerance (oral): Secondary | ICD-10-CM | POA: Diagnosis not present

## 2023-07-04 DIAGNOSIS — K279 Peptic ulcer, site unspecified, unspecified as acute or chronic, without hemorrhage or perforation: Secondary | ICD-10-CM | POA: Diagnosis not present

## 2023-07-04 DIAGNOSIS — E782 Mixed hyperlipidemia: Secondary | ICD-10-CM

## 2023-07-04 DIAGNOSIS — F17210 Nicotine dependence, cigarettes, uncomplicated: Secondary | ICD-10-CM

## 2023-07-04 DIAGNOSIS — E559 Vitamin D deficiency, unspecified: Secondary | ICD-10-CM | POA: Diagnosis not present

## 2023-07-04 DIAGNOSIS — E538 Deficiency of other specified B group vitamins: Secondary | ICD-10-CM

## 2023-07-04 DIAGNOSIS — I10 Essential (primary) hypertension: Secondary | ICD-10-CM

## 2023-07-04 DIAGNOSIS — Z122 Encounter for screening for malignant neoplasm of respiratory organs: Secondary | ICD-10-CM

## 2023-07-04 LAB — POCT GLYCOSYLATED HEMOGLOBIN (HGB A1C): Hemoglobin A1C: 5.8 % — AB (ref 4.0–5.6)

## 2023-07-04 LAB — VITAMIN D 25 HYDROXY (VIT D DEFICIENCY, FRACTURES): VITD: 42.06 ng/mL (ref 30.00–100.00)

## 2023-07-04 MED ORDER — CYANOCOBALAMIN 1000 MCG/ML IJ SOLN
1000.0000 ug | Freq: Once | INTRAMUSCULAR | Status: AC
  Administered 2023-07-04: 1000 ug via INTRAMUSCULAR

## 2023-07-04 NOTE — Telephone Encounter (Signed)
Lung Cancer Screening Narrative/Criteria Questionnaire (Cigarette Smokers Only- No Cigars/Pipes/vapes)   Christian Howard   SDMV:07/18/23 at 1230pm      Provider: Baxter Hire  [birth year]              LDCT: 07/25/23 at 1pm / [address]    63 y.o.   Phone: [phone]  Lung Screening Narrative (confirm age 5-77 yrs Medicare / 50-80 yrs Private pay insurance)   Insurance information:BCBS   Referring Provider:[facility]   This screening involves an initial phone call with a team member from our program. It is called a shared decision making visit. The initial meeting is required by  insurance and Medicare to make sure you understand the program. This appointment takes about 15-20 minutes to complete. You will complete the screening scan at your scheduled date/time.  This scan takes about 5-10 minutes to complete. You can eat and drink normally before and after the scan.  Criteria questions for Lung Cancer Screening:   Are you a current or former smoker? Current Age began smoking: 63 yo  (quit x 2 years)   If you are a former smoker, what year did you quit smoking? NA (within 15 yrs)   To calculate your smoking history, I need an accurate estimate of how many packs of cigarettes you smoked per day and for how many years. (Not just the number of PPD you are now smoking)   Years smoking 46 x Packs per day 1/2 = Pack years 23   (at least 20 pack yrs)   (Make sure they understand that we need to know how much they have smoked in the past, not just the number of PPD they are smoking now)  Do you have a personal history of cancer?  No    Do you have a family history of cancer? Yes  (cancer type and and relative) 2 brothers but ? type  Are you coughing up blood?  No  Have you had unexplained weight loss of 15 lbs or more in the last 6 months? No  It looks like you meet all criteria.  When would be a good time for [place] to schedule you for this screening?   Additional information: N/A

## 2023-07-04 NOTE — Assessment & Plan Note (Signed)
Well-controlled on PPI therapy. ?

## 2023-07-04 NOTE — Assessment & Plan Note (Signed)
Received B12 injection today.   

## 2023-07-04 NOTE — Assessment & Plan Note (Signed)
Recheck lipids now that he is being more consistent with atorvastatin use.

## 2023-07-04 NOTE — Assessment & Plan Note (Signed)
Tadalafil refilled today.

## 2023-07-04 NOTE — Addendum Note (Signed)
Addended by: Kern Reap B on: 07/04/2023 11:22 AM   Modules accepted: Orders

## 2023-07-04 NOTE — Progress Notes (Signed)
Established Patient Office Visit     CC/Reason for Visit: Follow-up chronic conditions  HPI: Christian Howard is a 63 y.o. male who is coming in today for the above mentioned reasons. Past Medical History is significant for: Hypertension, hyperlipidemia, impaired glucose tolerance, peptic ulcer disease/GERD, ongoing tobacco use, vitamin D and B12 deficiency.  Recheck lipids today now that he is being more consistent with atorvastatin use.  Blood pressure is not well-controlled that he is taking his medication correctly.  He is requesting B12 injection that he is due for today.  Otherwise feeling well.  We had placed a referral for lung cancer screening, apparently he has not been notified.   Past Medical/Surgical History: Past Medical History:  Diagnosis Date   Blood transfusion without reported diagnosis 2001   states he had to have transfusion d/t gastric ulcer   Gastric ulcer with hemorrhage    Hypertension     Past Surgical History:  Procedure Laterality Date   WRIST SURGERY Right 1989    Social History:  reports that he has been smoking cigarettes. He has a 43 pack-year smoking history. He uses smokeless tobacco. He reports current alcohol use. He reports that he does not use drugs.  Allergies: No Known Allergies  Family History:  Family History  Problem Relation Age of Onset   Colon polyps Neg Hx    Colon cancer Neg Hx    Esophageal cancer Neg Hx    Stomach cancer Neg Hx    Rectal cancer Neg Hx      Current Outpatient Medications:    amLODipine (NORVASC) 5 MG tablet, Take 1 tablet (5 mg total) by mouth daily., Disp: 90 tablet, Rfl: 1   atorvastatin (LIPITOR) 20 MG tablet, Take 1 tablet (20 mg total) by mouth daily., Disp: 90 tablet, Rfl: 1   buPROPion (WELLBUTRIN XL) 150 MG 24 hr tablet, Take 1 tablet (150 mg total) by mouth daily., Disp: 90 tablet, Rfl: 1   pantoprazole (PROTONIX) 40 MG tablet, Take 1 tablet (40 mg total) by mouth daily., Disp: 90 tablet, Rfl:  1   tadalafil (CIALIS) 20 MG tablet, Take 1 tablet (20 mg total) by mouth daily as needed for erectile dysfunction., Disp: 10 tablet, Rfl: 2   valsartan-hydrochlorothiazide (DIOVAN-HCT) 160-25 MG tablet, Take 1 tablet by mouth daily., Disp: 90 tablet, Rfl: 1   Vitamin D, Ergocalciferol, (DRISDOL) 1.25 MG (50000 UNIT) CAPS capsule, Take 1 capsule (50,000 Units total) by mouth every 7 (seven) days for 12 doses., Disp: 12 capsule, Rfl: 0  Review of Systems:  Negative unless indicated in HPI.   Physical Exam: Vitals:   07/04/23 0925  BP: 120/70  Pulse: 76  Temp: 97.8 F (36.6 C)  TempSrc: Oral  SpO2: 99%  Weight: 198 lb 12.8 oz (90.2 kg)    Body mass index is 30.23 kg/m.   Physical Exam Vitals reviewed.  Constitutional:      Appearance: Normal appearance.  HENT:     Head: Normocephalic and atraumatic.  Eyes:     Conjunctiva/sclera: Conjunctivae normal.     Pupils: Pupils are equal, round, and reactive to light.  Cardiovascular:     Rate and Rhythm: Normal rate and regular rhythm.  Pulmonary:     Effort: Pulmonary effort is normal.     Breath sounds: Normal breath sounds.  Skin:    General: Skin is warm and dry.  Neurological:     General: No focal deficit present.     Mental Status: He  is alert and oriented to person, place, and time.  Psychiatric:        Mood and Affect: Mood normal.        Behavior: Behavior normal.        Thought Content: Thought content normal.        Judgment: Judgment normal.      Impression and Plan:  IGT (impaired glucose tolerance) -     POCT glycosylated hemoglobin (Hb A1C)  Essential hypertension Assessment & Plan: Well-controlled on current.   PUD (peptic ulcer disease) Assessment & Plan: Well-controlled on PPI therapy.   Erectile dysfunction, unspecified erectile dysfunction type Assessment & Plan: Tadalafil refilled today.   Mixed hyperlipidemia Assessment & Plan: Recheck lipids now that he is being more consistent  with atorvastatin use.   Vitamin B12 deficiency Assessment & Plan: Received B12 injection today.      Time spent:31 minutes reviewing chart, interviewing and examining patient and formulating plan of care.     Chaya Jan, MD [facility]

## 2023-07-04 NOTE — Assessment & Plan Note (Signed)
Well controlled on current

## 2023-07-08 ENCOUNTER — Encounter: Payer: Self-pay | Admitting: Internal Medicine

## 2023-07-18 ENCOUNTER — Ambulatory Visit (INDEPENDENT_AMBULATORY_CARE_PROVIDER_SITE_OTHER): Payer: BC Managed Care – PPO | Admitting: Acute Care

## 2023-07-18 DIAGNOSIS — F17210 Nicotine dependence, cigarettes, uncomplicated: Secondary | ICD-10-CM | POA: Diagnosis not present

## 2023-07-18 NOTE — Progress Notes (Signed)
 Provider Attestation I agree with the documentation of the Shared Decision Making visit,  smoking cessation counseling if appropriate, and verification or eligibility for lung cancer screening as documented by the RN Nurse Navigator.   [name], MSN, AGACNP-BC [facility] See Amion for personal pager PCCM on call pager [phone]    Virtual Visit via Telephone Note  I connected with Christian Howard on 07/18/23 at 12:30 PM EST by telephone and verified that I am speaking with the correct person using two identifiers.  Location: Patient: in home Provider: [address], [place], [place], [address]    Shared Decision Making Visit Lung Cancer Screening Program [phone])   Eligibility: Age 63 y.o. Pack Years Smoking History Calculation 23 (# packs/per year x # years smoked) Recent History of coughing up blood  no Unexplained weight loss? no ( >Than 15 pounds within the last 6 months ) Prior History Lung / other cancer no (Diagnosis within the last 5 years already requiring surveillance chest CT Scans). Smoking Status Current Smoker Former Smokers: Years since quit:  NA  Quit Date: NA  Visit Components: Discussion included one or more decision making aids. yes Discussion included risk/benefits of screening. yes Discussion included potential follow up diagnostic testing for abnormal scans. yes Discussion included meaning and risk of over diagnosis. yes Discussion included meaning and risk of False Positives. yes Discussion included meaning of total radiation exposure. yes  Counseling Included: Importance of adherence to annual lung cancer LDCT screening. yes Impact of comorbidities on ability to participate in the program. yes Ability and willingness to under diagnostic treatment. yes  Smoking Cessation Counseling: Current Smokers:  Discussed importance of smoking cessation. yes Information about tobacco cessation classes and  interventions provided to patient. yes Patient provided with "ticket" for LDCT Scan. yes Symptomatic Patient. no  Counseling NA Diagnosis Code: Tobacco Use Z72.0 Asymptomatic Patient yes  Counseling (Intermediate counseling: > three minutes counseling) H8469 Former Smokers:  Discussed the importance of maintaining cigarette abstinence. yes Diagnosis Code: Personal History of Nicotine Dependence. G29.528 Information about tobacco cessation classes and interventions provided to patient. Yes Patient provided with "ticket" for LDCT Scan. yes Written Order for Lung Cancer Screening with LDCT placed in Epic. Yes (CT Chest Lung Cancer Screening Low Dose W/O CM) [id number] Z12.2-Screening of respiratory organs Z87.891-Personal history of nicotine dependence   [name], RN 07/18/23

## 2023-07-18 NOTE — Patient Instructions (Signed)

## 2023-07-19 ENCOUNTER — Encounter: Payer: Self-pay | Admitting: Acute Care

## 2023-07-25 ENCOUNTER — Ambulatory Visit
Admission: RE | Admit: 2023-07-25 | Discharge: 2023-07-25 | Disposition: A | Discharge status: Home / Self Care | Payer: BC Managed Care – PPO | Source: Ambulatory Visit | Attending: Internal Medicine | Admitting: Internal Medicine

## 2023-07-25 DIAGNOSIS — Z122 Encounter for screening for malignant neoplasm of respiratory organs: Secondary | ICD-10-CM

## 2023-07-25 DIAGNOSIS — F17210 Nicotine dependence, cigarettes, uncomplicated: Secondary | ICD-10-CM

## 2023-07-25 DIAGNOSIS — Z87891 Personal history of nicotine dependence: Secondary | ICD-10-CM

## 2023-08-01 ENCOUNTER — Encounter: Payer: Self-pay | Admitting: Dermatology

## 2023-08-01 ENCOUNTER — Ambulatory Visit: Payer: BC Managed Care – PPO | Admitting: Dermatology

## 2023-08-01 VITALS — BP 150/84

## 2023-08-01 DIAGNOSIS — L720 Epidermal cyst: Secondary | ICD-10-CM

## 2023-08-01 NOTE — Progress Notes (Signed)
   New Patient Visit   Subjective  Christian Howard is a 63 y.o. male who presents for the following: Spot of his right abdomen that has been there about a year. It does not itch or hurt but it feels hard. Sometimes it feels bigger.  Accompanied by wife  The following portions of the chart were reviewed this encounter and updated as appropriate: medications, allergies, medical history  Review of Systems:  No other skin or systemic complaints except as noted in HPI or Assessment and Plan.  Objective  Well appearing patient in no apparent distress; mood and affect are within normal limits.   A focused examination was performed of the following areas: Abdomen  Relevant exam findings are noted in the Assessment and Plan.    Assessment & Plan   EPIDERMAL INCLUSION CYST Exam: Subcutaneous nodule at right waistline    Benign-appearing. Exam most consistent with an epidermal inclusion cyst. Discussed that a cyst is a benign growth that can grow over time and sometimes get irritated or inflamed. Recommend observation if it is not bothersome. Discussed option of surgical excision to remove it if it is growing, symptomatic, or other changes noted. Please call for new or changing lesions so they can be evaluated. Patient would like to schedule surgery    Return for Surgery cyst of right waistline.  I, Joanie Coddington, CMA, am acting as scribe for Gwenith Daily, MD .   Documentation: I have reviewed the above documentation for accuracy and completeness, and I agree with the above.  Gwenith Daily, MD

## 2023-08-01 NOTE — Patient Instructions (Signed)

## 2023-08-08 ENCOUNTER — Encounter: Payer: Self-pay | Admitting: Internal Medicine

## 2023-08-08 ENCOUNTER — Encounter: Payer: BC Managed Care – PPO | Admitting: Internal Medicine

## 2023-08-08 ENCOUNTER — Telehealth: Payer: Self-pay | Admitting: *Deleted

## 2023-08-08 DIAGNOSIS — R918 Other nonspecific abnormal finding of lung field: Secondary | ICD-10-CM

## 2023-08-08 NOTE — Telephone Encounter (Signed)
 Call report from [facility] Radiology:  IMPRESSION: 1. Lung-RADS 4B, suspicious. Additional imaging evaluation or consultation with Pulmonology or Thoracic Surgery recommended. Spiculated 11 mm right upper lobe pulmonary nodule is highly suspicious for primary bronchogenic carcinoma. Recommend multidisciplinary thoracic oncology consultation for eventual PET and tissue sampling. 2. No thoracic adenopathy. 3. A right lower lobe pulmonary nodule is favored to represent a benign granuloma or hamartoma. Recommend attention on follow-up. 4. Aortic atherosclerosis (ICD10-I70.0), coronary artery atherosclerosis and emphysema (ICD10-J43.9).

## 2023-08-08 NOTE — Progress Notes (Signed)
 This encounter was created in error - please disregard.

## 2023-08-09 NOTE — Telephone Encounter (Signed)
 I have called the patient with the results of his low-dose screening CT.  His scan was read as a lung RADS 4B. There is a spiculated 11 mm right upper lobe pulmonary nodule that is suspicious for primary bronchogenic carcinoma. There is also a right lower lobe pulmonary nodule that is favored to be a benign granuloma or hamartoma. Christian Howard and I discussed this and I told him next best step would be to do a PET scan to further evaluate that right upper lobe pulmonary nodule. He is in agreement with this plan. I will place the order for PET and make sure the lung nodule team schedules the patient for follow-up with me in the [place] office within a week of the scan being completed. At that point we will review the scan and determine next best steps in plan of care. Please fax results to PCP let them know plan I will place the order for the PET Thank you so much

## 2023-08-12 NOTE — Telephone Encounter (Signed)
 Reminder set for office f/u. Results and plan sent to PCP.

## 2023-08-19 ENCOUNTER — Ambulatory Visit (HOSPITAL_COMMUNITY): Payer: BC Managed Care – PPO

## 2023-08-21 ENCOUNTER — Encounter: Payer: Self-pay | Admitting: Dermatology

## 2023-08-21 NOTE — Telephone Encounter (Signed)
 Patient is scheduled for PET scan on 3/7. Patient has a current ov with Kandice Robinsons NP on 3/21. Abigail Miyamoto RN spoke with Maralyn Sago and was advised to add pt on to [facility] schedule on 3/12 at 130. I called and spoke with pt and he is unable to make appt on 3/12 as he is working, he states he can only make appts on Thursday and Fridays. I advised pt we will see if we can schedule him something sooner or a Th/F. Will update chart after finding where we can work pt in.

## 2023-08-22 ENCOUNTER — Ambulatory Visit: Payer: BC Managed Care – PPO | Admitting: Dermatology

## 2023-08-22 ENCOUNTER — Encounter: Payer: Self-pay | Admitting: Dermatology

## 2023-08-22 DIAGNOSIS — D492 Neoplasm of unspecified behavior of bone, soft tissue, and skin: Secondary | ICD-10-CM

## 2023-08-22 DIAGNOSIS — D485 Neoplasm of uncertain behavior of skin: Secondary | ICD-10-CM

## 2023-08-22 DIAGNOSIS — L7211 Pilar cyst: Secondary | ICD-10-CM

## 2023-08-22 NOTE — Patient Instructions (Signed)

## 2023-08-22 NOTE — Progress Notes (Signed)
 Follow-Up Visit   Subjective  Christian Howard is a 63 y.o. male who presents for the following: Excision of a neoplasm of skin on the right waistline. He is accompanied by his wife.   The following portions of the chart were reviewed this encounter and updated as appropriate: medications, allergies, medical history  Review of Systems:  No other skin or systemic complaints except as noted in HPI or Assessment and Plan.  Objective  Well appearing patient in no apparent distress; mood and affect are within normal limits.  A focused examination was performed of the following areas: Right waistline Relevant physical exam findings are noted in the Assessment and Plan.     Assessment & Plan   NEOPLASM OF UNCERTAIN BEHAVIOR OF SKIN Right waistline Skin excision  Excision method:  elliptical Lesion length (cm):  1.4 Lesion width (cm):  2.1 Margin per side (cm):  0.1 Total excision diameter (cm):  2.3 Informed consent: discussed and consent obtained   Timeout: patient name, date of birth, surgical site, and procedure verified   Procedure prep:  Patient was prepped and draped in usual sterile fashion Prep type:  Chlorhexidine Anesthesia: the lesion was anesthetized in a standard fashion   Anesthetic:  1% lidocaine w/ epinephrine 1-100,000 buffered w/ 8.4% NaHCO3 Instrument used: #15 blade   Hemostasis achieved with: suture, pressure and electrodesiccation   Outcome: patient tolerated procedure well with no complications   Post-procedure details: sterile dressing applied and wound care instructions given   Dressing type: pressure dressing and bandage    Skin repair Complexity:  Complex Final length (cm):  3.6 Informed consent: discussed and consent obtained   Timeout: patient name, date of birth, surgical site, and procedure verified   Procedure prep:  Patient was prepped and draped in usual sterile fashion Prep type:  Chlorhexidine Anesthesia: the lesion was anesthetized in a  standard fashion   Anesthetic:  1% lidocaine w/ epinephrine 1-100,000 buffered w/ 8.4% NaHCO3 Reason for type of repair: reduce tension to allow closure, reduce the risk of dehiscence, infection, and necrosis, reduce subcutaneous dead space and avoid a hematoma, allow closure of the large defect, preserve normal anatomy, preserve normal anatomical and functional relationships and enhance both functionality and cosmetic results   Undermining: area extensively undermined   Undermining comment:  Undermining defect  Subcutaneous layers (deep stitches):  Suture size:  3-0 Suture type: PDS (polydioxanone) and cyanoacrylate tissue glue   Suture type comment:  Steri strips Stitches:  Buried vertical mattress (inverted dermal) Fine/surface layer approximation (top stitches):  Suture type: nylon   Stitches: simple running   Suture removal (days):  7 Hemostasis achieved with: suture and pressure Outcome: patient tolerated procedure well with no complications   Post-procedure details: sterile dressing applied and wound care instructions given   Dressing type: bandage and pressure dressing (mupirocin)   Specimen 1 - Surgical pathology Differential Diagnosis: R/O cyst vs other  Check Margins: No  The surgical wound was then cleaned, prepped, and re-anesthetized as above. Wound edges were undermined extensively along at least one entire edge and at a distance equal to or greater than the width of the defect (see wound defect size above) in order to achieve closure and decrease wound tension and anatomic distortion. Redundant tissue repair including standing cone removal was performed. Hemostasis was achieved with electrocautery. Subcutaneous and epidermal tissues were approximated with the above sutures. The surgical site was then lightly scrubbed with sterile, saline-soaked gauze. The area was then bandaged using Vaseline ointment,  non-adherent gauze, gauze pads, and tape to provide an adequate pressure  dressing. The patient tolerated the procedure well, was given detailed written and verbal wound care instructions, and was discharged in good condition.   The patient will follow-up: PRN  Return if symptoms worsen or fail to improve.  I, Joanie Coddington, CMA, am acting as scribe for Gwenith Daily, MD .   Documentation: I have reviewed the above documentation for accuracy and completeness, and I agree with the above.  Gwenith Daily, MD

## 2023-08-23 ENCOUNTER — Ambulatory Visit (HOSPITAL_COMMUNITY)
Admission: RE | Admit: 2023-08-23 | Discharge: 2023-08-23 | Disposition: A | Discharge status: Home / Self Care | Payer: BC Managed Care – PPO | Source: Ambulatory Visit | Attending: Acute Care | Admitting: Acute Care

## 2023-08-23 ENCOUNTER — Encounter: Payer: Self-pay | Admitting: Internal Medicine

## 2023-08-23 ENCOUNTER — Encounter (HOSPITAL_COMMUNITY): Payer: Self-pay

## 2023-08-23 DIAGNOSIS — R918 Other nonspecific abnormal finding of lung field: Secondary | ICD-10-CM | POA: Insufficient documentation

## 2023-08-23 LAB — GLUCOSE, CAPILLARY: Glucose-Capillary: 124 mg/dL — ABNORMAL HIGH (ref 70–99)

## 2023-08-23 MED ORDER — FLUDEOXYGLUCOSE F - 18 (FDG) INJECTION
10.1500 | Freq: Once | INTRAVENOUS | Status: AC
  Administered 2023-08-23: 10.09 via INTRAVENOUS

## 2023-08-26 ENCOUNTER — Telehealth: Payer: Self-pay | Admitting: *Deleted

## 2023-08-26 LAB — SURGICAL PATHOLOGY

## 2023-08-26 MED ORDER — ALPRAZOLAM 2 MG PO TABS: ORAL_TABLET | ORAL | 0 refills | Status: DC

## 2023-08-26 MED ORDER — ALPRAZOLAM 0.25 MG PO TABS: ORAL_TABLET | ORAL | 0 refills | Status: DC

## 2023-08-26 NOTE — Telephone Encounter (Signed)
**Note De-identified  [place] Obfuscation** Please advise 

## 2023-08-26 NOTE — Telephone Encounter (Signed)
 Returned call. Advised to call PCP and request meds. Patient verbalized understanding.

## 2023-08-26 NOTE — Telephone Encounter (Signed)
 Please resched PET/CT for PT and advise about relaxer RX he was wondering about. Would we RX or his PCP? His # is [phone]

## 2023-08-29 NOTE — Telephone Encounter (Signed)
 nfn

## 2023-08-30 ENCOUNTER — Ambulatory Visit: Admitting: Acute Care

## 2023-09-03 ENCOUNTER — Encounter (HOSPITAL_COMMUNITY): Admission: RE | Admit: 2023-09-03 | Source: Ambulatory Visit

## 2023-09-04 ENCOUNTER — Ambulatory Visit: Payer: BC Managed Care – PPO | Admitting: Acute Care

## 2023-09-05 ENCOUNTER — Ambulatory Visit (HOSPITAL_COMMUNITY)
Admission: RE | Admit: 2023-09-05 | Discharge: 2023-09-05 | Disposition: A | Discharge status: Home / Self Care | Source: Ambulatory Visit | Attending: Acute Care | Admitting: Acute Care

## 2023-09-05 DIAGNOSIS — R918 Other nonspecific abnormal finding of lung field: Secondary | ICD-10-CM | POA: Insufficient documentation

## 2023-09-05 LAB — GLUCOSE, CAPILLARY: Glucose-Capillary: 105 mg/dL — ABNORMAL HIGH (ref 70–99)

## 2023-09-05 MED ORDER — FLUDEOXYGLUCOSE F - 18 (FDG) INJECTION
10.1500 | Freq: Once | INTRAVENOUS | Status: AC
  Administered 2023-09-05: 10.45 via INTRAVENOUS

## 2023-09-06 ENCOUNTER — Ambulatory Visit: Payer: BC Managed Care – PPO | Admitting: Acute Care

## 2023-09-20 ENCOUNTER — Encounter: Payer: Self-pay | Admitting: Acute Care

## 2023-09-20 ENCOUNTER — Ambulatory Visit: Admitting: Acute Care

## 2023-09-20 VITALS — BP 126/68 | HR 73 | Ht 68.0 in | Wt 201.0 lb

## 2023-09-20 DIAGNOSIS — F17200 Nicotine dependence, unspecified, uncomplicated: Secondary | ICD-10-CM | POA: Diagnosis not present

## 2023-09-20 DIAGNOSIS — R911 Solitary pulmonary nodule: Secondary | ICD-10-CM

## 2023-09-20 DIAGNOSIS — R599 Enlarged lymph nodes, unspecified: Secondary | ICD-10-CM

## 2023-09-20 NOTE — Progress Notes (Signed)
 History of Present Illness Christian Howard is a 63 y.o. male  every day smoker followed through the screening program , here for follow up of an  abnormal screening scan. He will be followed by Dr. Delton Coombes.   09/20/2023 Pt. Presents for follow up after PET scan. He states he has been doing well from a respiratory standpoint. He is here to review PET scan after an abnormal screening CT Chest.  We have reviewed the results of his PET scan.  PET scan is positive for low-level uptake in the right upper lobe as well as the right hilar node.  I discussed that the next best option is for bronchoscopy with biopsy.  We discussed the risks and benefits of biopsy to include bleeding, infection, pneumothorax, and adverse reaction to anesthesia.  Patient and his wife have agreed that this is the next best step.  Navigational bronchoscopy with biopsies and EBUS have been ordered.  Patient and his wife understand he will need someone to drive him to the procedure, stay during the procedure, and drive home with the patient after the procedure.  Additionally they understand he will need someone to monitor him for 24 hours after the procedure to ensure he has cleared anesthesia.  Patient's wife states she can be with him the day of the procedure and for 24 hours after.  Both the patient and his wife were given the opportunity to ask questions and voiced concerns.  They understand that they will follow-up with me approximately 1 week after the procedure to review the cytology and ensure he has done well. Both patient and his wife were given informed consent and agreed to move forward with the bronchoscopy with biopsies and EBUS.  Test Results: 09/05/2023 PET Scan  Mild to moderate radiotracer uptake in the spiculated nodule described previously right upper lobe near the hilum of maximum SUV of 3.0. With this level uptake there is a differential including a low-grade malignancy. There is also a right hilar node which  has some increased uptake as well. Considerations would be tissue sampling with the combination of these findings.  LDCT 07/25/2023 Lung-RADS 4B, suspicious. Additional imaging evaluation or consultation with Pulmonology or Thoracic Surgery recommended. Spiculated 11 mm right upper lobe pulmonary nodule is highly suspicious for primary bronchogenic carcinoma. Recommend multidisciplinary thoracic oncology consultation for eventual PET and tissue sampling. 2. No thoracic adenopathy. 3. A right lower lobe pulmonary nodule is favored to represent a benign granuloma or hamartoma. Recommend attention on follow-up. 4. Aortic atherosclerosis (ICD10-I70.0), coronary artery atherosclerosis and emphysema (ICD10-J43.9).     Latest Ref Rng & Units 04/11/2023    3:58 PM 02/08/2022    8:03 AM 01/20/2022    9:45 AM  CBC  WBC 4.0 - 10.5 K/uL 7.5  8.7  6.9   Hemoglobin 13.0 - 17.0 g/dL 40.9  81.1  91.4   Hematocrit 39.0 - 52.0 % 50.5  47.1  48.2   Platelets 150.0 - 400.0 K/uL 270.0  248.0  242        Latest Ref Rng & Units 04/11/2023    3:58 PM 02/08/2022    8:03 AM 01/20/2022    9:45 AM  BMP  Glucose 70 - 99 mg/dL 85  92  93   BUN 6 - 23 mg/dL 12  14  9    Creatinine 0.40 - 1.50 mg/dL 7.82  9.56  2.13   Sodium 135 - 145 mEq/L 135  135  133   Potassium 3.5 - 5.1 mEq/L  3.9  3.9  4.0   Chloride 96 - 112 mEq/L 103  105  102   CO2 19 - 32 mEq/L 23  23  21    Calcium 8.4 - 10.5 mg/dL 9.2  8.9  8.8     BNP No results found for: "BNP"  ProBNP No results found for: "PROBNP"  PFT No results found for: "FEV1PRE", "FEV1POST", "FVCPRE", "FVCPOST", "TLC", "DLCOUNC", "PREFEV1FVCRT", "PSTFEV1FVCRT"  NM PET Image Initial (PI) Skull Base To Thigh Result Date: 09/17/2023 CLINICAL DATA:  Lung nodule EXAM: NUCLEAR MEDICINE PET SKULL BASE TO THIGH TECHNIQUE: 10.09 mCi F-18 FDG was injected intravenously. Full-ring PET imaging was performed from the skull base to thigh after the radiotracer. CT data was obtained  and used for attenuation correction and anatomic localization. Fasting blood glucose: 124 mg/dl COMPARISON:  CT 60/45/4098 FINDINGS: Mediastinal blood pool activity: SUV max 2.8 Liver activity: SUV max 3.3 NECK: No specific abnormal radiotracer seen along the neck including along lymph node changes of the submandibular, posterior triangle or internal jugular regions. Near symmetric distribution of radiotracer along the visualized intracranial compartment. Some mucosal physiologic uptake identified along the pharynx. Incidental CT findings: Mucosal thickening of the left maxillary paranasal sinus with some luminal debris. Visualized mastoid air cells are clear. Vascular calcifications are identified in the neck including intracranially and along the carotid bulb regions. Preserved parotid glands, submandibular glands and thyroid gland. No discrete abnormal lymph node enlargement in the neck on the noncontrast attenuation correction CT. CHEST: The previous spiculated 11 mm right upper lobe perihilar lung nodule is again identified and is similar in size on CT image 77. This has some moderate uptake with maximum SUV of 3.0. Previously there is also a benign-appearing nodule with central calcification in the right lower lobe which has no significant abnormal uptake. Only maximum SUV of 1.2. The previously seen 4 mm right lower lobe lung nodule is again noted on CT image 81. The Peri fissural nodule left lung is also present on image 84. These areas do not show any abnormal uptake but are quite small. There is a prominent right hilar lymph node which has some mild uptake. Maximum SUV of 4.6. Node measures 11 x 7 mm on series 4, image 73. No additional areas of abnormal uptake along the mediastinum above blood pool. Incidental CT findings: Scattered vascular calcifications are seen including along the coronary arteries. No pleural effusion or pneumothorax. ABDOMEN/PELVIS: Physiologic distribution of radiotracer noted  along the parenchymal organs, bowel and renal collecting systems. Incidental CT findings: Grossly preserved appearance to the liver, spleen, adrenal glands, pancreas. No abnormal calcification seen within either kidney nor along the course of either ureter. Gallbladder is present. Scattered vascular calcifications. Underdistended urinary bladder. The visualized bowel is nondilated. Scattered diffuse colonic stool. Breathing motion. Normal appendix. SKELETON: Scattered degenerative changes of the spine and pelvis. Bridging osteophytes along the sacroiliac joints. No areas of abnormal uptake along the skeleton. IMPRESSION: Mild to moderate radiotracer uptake in the spiculated nodule described previously right upper lobe near the hilum of maximum SUV of 3.0. With this level uptake there is a differential including a low-grade malignancy. There is also a right hilar node which has some increased uptake as well. Considerations would be tissue sampling with the combination of these findings. Additional option would be short-term three-month follow-up as there is no more remote prior. Other small lung nodules do not show abnormal uptake. Electronically Signed   By: Karen Kays M.D.   On: 09/17/2023 14:02  Past medical hx Past Medical History:  Diagnosis Date   Blood transfusion without reported diagnosis 2001   states he had to have transfusion d/t gastric ulcer   Gastric ulcer with hemorrhage    Hypertension      Social History   Tobacco Use   Smoking status: Every Day    Current packs/day: 1.00    Average packs/day: 1 pack/day for 43.0 years (43.0 ttl pk-yrs)    Types: Cigarettes   Smokeless tobacco: Current  Vaping Use   Vaping status: Never Used  Substance Use Topics   Alcohol use: Yes    Comment: occasional   Drug use: No    Mr.Crisci reports that he has been smoking cigarettes. He has a 43 pack-year smoking history. He uses smokeless tobacco. He reports current alcohol use. He reports  that he does not use drugs.  Tobacco Cessation: Ready to quit: Not Answered Counseling given: Not Answered Current Every Day Smoker I have counseled him to quit x 3-4 minutes, and have provided resources to help with quitting. See AVS  Past surgical hx, Family hx, Social hx all reviewed.  Current Outpatient Medications on File Prior to Visit  Medication Sig   ALPRAZolam (XANAX) 2 MG tablet take once 30 mins prior to procedure   amLODipine (NORVASC) 5 MG tablet Take 1 tablet (5 mg total) by mouth daily.   atorvastatin (LIPITOR) 20 MG tablet Take 1 tablet (20 mg total) by mouth daily.   buPROPion (WELLBUTRIN XL) 150 MG 24 hr tablet Take 1 tablet (150 mg total) by mouth daily.   pantoprazole (PROTONIX) 40 MG tablet Take 1 tablet (40 mg total) by mouth daily.   tadalafil (CIALIS) 20 MG tablet Take 1 tablet (20 mg total) by mouth daily as needed for erectile dysfunction.   valsartan-hydrochlorothiazide (DIOVAN-HCT) 160-25 MG tablet Take 1 tablet by mouth daily.   No current facility-administered medications on file prior to visit.     No Known Allergies  Review Of Systems:  Constitutional:   No  weight loss, night sweats,  Fevers, chills, fatigue, or  lassitude.  HEENT:   No headaches,  Difficulty swallowing,  Tooth/dental problems, or  Sore throat,                No sneezing, itching, ear ache, nasal congestion, post nasal drip,   CV:  No chest pain,  Orthopnea, PND, swelling in lower extremities, anasarca, dizziness, palpitations, syncope.   GI  No heartburn, indigestion, abdominal pain, nausea, vomiting, diarrhea, change in bowel habits, loss of appetite, bloody stools.   Resp: No shortness of breath with exertion or at rest.  No excess mucus, no productive cough,  No non-productive cough,  No coughing up of blood.  No change in color of mucus.  No wheezing.  No chest wall deformity  Skin: no rash or lesions.  GU: no dysuria, change in color of urine, no urgency or frequency.   No flank pain, no hematuria   MS:  No joint pain or swelling.  No decreased range of motion.  No back pain.  Psych:  No change in mood or affect. No depression or anxiety.  No memory loss.   Vital Signs BP 126/68 (BP Location: Left Arm, Patient Position: Sitting, Cuff Size: Large)   Pulse 73   Ht 5\' 8"  (1.727 m)   Wt 201 lb (91.2 kg)   SpO2 96%   BMI 30.56 kg/m    Physical Exam:  General- No distress,  A&Ox3, pleasant  ENT: No sinus tenderness, TM clear, pale nasal mucosa, no oral exudate,no post nasal drip, no LAN Cardiac: S1, S2, regular rate and rhythm, no murmur Chest: No wheeze/ rales/ dullness; no accessory muscle use, no nasal flaring, no sternal retractions Abd.: Soft Non-tender, ND, BS +, Body mass index is 30.56 kg/m.  Ext: No clubbing cyanosis, edema, no obvious deformities Neuro:  normal strength, MAE x 4, A&O x 3, pleasant Skin: No rashes, warm and dry, no obvious lesions  Psych: normal mood and behavior, anxious   Assessment/Plan PET Avid Pulmonary Nodule in current every day smoker 4B LDCT Plan We have reviewed the risks and benefits of the procedure. These include bleeding, infection, puncture of the lung, and adverse reaction to anesthesia. You have agreed to proceed with biopsy to evaluate the left upper lobe nodule. Your procedure will be done by Dr. Levy Pupa. You will receive a letter today with date time and information pertaining to the procedure. You will need someone to drive you to the procedure, stay with you during the procedure, and stay with you after the procedure. You will also need someone to stay with you for 24 hours after anesthesia to ensure you have cleared anesthesia and are doing well. You will follow-up with me 1 week after the procedure to review the results of the biopsy and to ensure you are doing well. Call if you need [place] prior to the procedure or if you have any questions at all. Please contact office for sooner follow up if  symptoms do not improve or worsen or seek emergency care     Please work on quitting smoking. I know this is hard, but it is in the best interest of your health.  You can receive free nicotine replacement therapy (patches, gum, or mints) by calling 1-800-QUIT NOW. Please call so we can get you on the path to becoming a non-smoker. I know it is hard, but you can do this!  Hypnosis for smoking cessation  [employer] [phone]  Acupuncture for smoking cessation  [employer] [phone]    I spent 40 minutes dedicated to the care of this patient on the date of this encounter to include pre-visit review of records, face-to-face time with the patient discussing conditions above, post visit ordering of testing, clinical documentation with the electronic health record, making appropriate referrals as documented, and communicating necessary information to the patient's healthcare team.   Bevelyn Ngo, NP 09/20/2023  8:35 AM

## 2023-09-20 NOTE — H&P (View-Only) (Signed)
 History of Present Illness Christian Howard is a 62 y.o. male  every day smoker followed through the screening program , here for follow up of an  abnormal screening scan. He will be followed by Dr. Delton Coombes.   09/20/2023 Pt. Presents for follow up after PET scan. He states he has been doing well from a respiratory standpoint. He is here to review PET scan after an abnormal screening CT Chest.  We have reviewed the results of his PET scan.  PET scan is positive for low-level uptake in the right upper lobe as well as the right hilar node.  I discussed that the next best option is for bronchoscopy with biopsy.  We discussed the risks and benefits of biopsy to include bleeding, infection, pneumothorax, and adverse reaction to anesthesia.  Patient and his wife have agreed that this is the next best step.  Navigational bronchoscopy with biopsies and EBUS have been ordered.  Patient and his wife understand he will need someone to drive him to the procedure, stay during the procedure, and drive home with the patient after the procedure.  Additionally they understand he will need someone to monitor him for 24 hours after the procedure to ensure he has cleared anesthesia.  Patient's wife states she can be with him the day of the procedure and for 24 hours after.  Both the patient and his wife were given the opportunity to ask questions and voiced concerns.  They understand that they will follow-up with me approximately 1 week after the procedure to review the cytology and ensure he has done well. Both patient and his wife were given informed consent and agreed to move forward with the bronchoscopy with biopsies and EBUS.  Test Results: 09/05/2023 PET Scan  Mild to moderate radiotracer uptake in the spiculated nodule described previously right upper lobe near the hilum of maximum SUV of 3.0. With this level uptake there is a differential including a low-grade malignancy. There is also a right hilar node which  has some increased uptake as well. Considerations would be tissue sampling with the combination of these findings.  LDCT 07/25/2023 Lung-RADS 4B, suspicious. Additional imaging evaluation or consultation with Pulmonology or Thoracic Surgery recommended. Spiculated 11 mm right upper lobe pulmonary nodule is highly suspicious for primary bronchogenic carcinoma. Recommend multidisciplinary thoracic oncology consultation for eventual PET and tissue sampling. 2. No thoracic adenopathy. 3. A right lower lobe pulmonary nodule is favored to represent a benign granuloma or hamartoma. Recommend attention on follow-up. 4. Aortic atherosclerosis (ICD10-I70.0), coronary artery atherosclerosis and emphysema (ICD10-J43.9).     Latest Ref Rng & Units 04/11/2023    3:58 PM 02/08/2022    8:03 AM 01/20/2022    9:45 AM  CBC  WBC 4.0 - 10.5 K/uL 7.5  8.7  6.9   Hemoglobin 13.0 - 17.0 g/dL 40.9  81.1  91.4   Hematocrit 39.0 - 52.0 % 50.5  47.1  48.2   Platelets 150.0 - 400.0 K/uL 270.0  248.0  242        Latest Ref Rng & Units 04/11/2023    3:58 PM 02/08/2022    8:03 AM 01/20/2022    9:45 AM  BMP  Glucose 70 - 99 mg/dL 85  92  93   BUN 6 - 23 mg/dL 12  14  9    Creatinine 0.40 - 1.50 mg/dL 7.82  9.56  2.13   Sodium 135 - 145 mEq/L 135  135  133   Potassium 3.5 - 5.1 mEq/L  3.9  3.9  4.0   Chloride 96 - 112 mEq/L 103  105  102   CO2 19 - 32 mEq/L 23  23  21    Calcium 8.4 - 10.5 mg/dL 9.2  8.9  8.8     BNP No results found for: "BNP"  ProBNP No results found for: "PROBNP"  PFT No results found for: "FEV1PRE", "FEV1POST", "FVCPRE", "FVCPOST", "TLC", "DLCOUNC", "PREFEV1FVCRT", "PSTFEV1FVCRT"  NM PET Image Initial (PI) Skull Base To Thigh Result Date: 09/17/2023 CLINICAL DATA:  Lung nodule EXAM: NUCLEAR MEDICINE PET SKULL BASE TO THIGH TECHNIQUE: 10.09 mCi F-18 FDG was injected intravenously. Full-ring PET imaging was performed from the skull base to thigh after the radiotracer. CT data was obtained  and used for attenuation correction and anatomic localization. Fasting blood glucose: 124 mg/dl COMPARISON:  CT 60/45/4098 FINDINGS: Mediastinal blood pool activity: SUV max 2.8 Liver activity: SUV max 3.3 NECK: No specific abnormal radiotracer seen along the neck including along lymph node changes of the submandibular, posterior triangle or internal jugular regions. Near symmetric distribution of radiotracer along the visualized intracranial compartment. Some mucosal physiologic uptake identified along the pharynx. Incidental CT findings: Mucosal thickening of the left maxillary paranasal sinus with some luminal debris. Visualized mastoid air cells are clear. Vascular calcifications are identified in the neck including intracranially and along the carotid bulb regions. Preserved parotid glands, submandibular glands and thyroid gland. No discrete abnormal lymph node enlargement in the neck on the noncontrast attenuation correction CT. CHEST: The previous spiculated 11 mm right upper lobe perihilar lung nodule is again identified and is similar in size on CT image 77. This has some moderate uptake with maximum SUV of 3.0. Previously there is also a benign-appearing nodule with central calcification in the right lower lobe which has no significant abnormal uptake. Only maximum SUV of 1.2. The previously seen 4 mm right lower lobe lung nodule is again noted on CT image 81. The Peri fissural nodule left lung is also present on image 84. These areas do not show any abnormal uptake but are quite small. There is a prominent right hilar lymph node which has some mild uptake. Maximum SUV of 4.6. Node measures 11 x 7 mm on series 4, image 73. No additional areas of abnormal uptake along the mediastinum above blood pool. Incidental CT findings: Scattered vascular calcifications are seen including along the coronary arteries. No pleural effusion or pneumothorax. ABDOMEN/PELVIS: Physiologic distribution of radiotracer noted  along the parenchymal organs, bowel and renal collecting systems. Incidental CT findings: Grossly preserved appearance to the liver, spleen, adrenal glands, pancreas. No abnormal calcification seen within either kidney nor along the course of either ureter. Gallbladder is present. Scattered vascular calcifications. Underdistended urinary bladder. The visualized bowel is nondilated. Scattered diffuse colonic stool. Breathing motion. Normal appendix. SKELETON: Scattered degenerative changes of the spine and pelvis. Bridging osteophytes along the sacroiliac joints. No areas of abnormal uptake along the skeleton. IMPRESSION: Mild to moderate radiotracer uptake in the spiculated nodule described previously right upper lobe near the hilum of maximum SUV of 3.0. With this level uptake there is a differential including a low-grade malignancy. There is also a right hilar node which has some increased uptake as well. Considerations would be tissue sampling with the combination of these findings. Additional option would be short-term three-month follow-up as there is no more remote prior. Other small lung nodules do not show abnormal uptake. Electronically Signed   By: Karen Kays M.D.   On: 09/17/2023 14:02  Past medical hx Past Medical History:  Diagnosis Date   Blood transfusion without reported diagnosis 2001   states he had to have transfusion d/t gastric ulcer   Gastric ulcer with hemorrhage    Hypertension      Social History   Tobacco Use   Smoking status: Every Day    Current packs/day: 1.00    Average packs/day: 1 pack/day for 43.0 years (43.0 ttl pk-yrs)    Types: Cigarettes   Smokeless tobacco: Current  Vaping Use   Vaping status: Never Used  Substance Use Topics   Alcohol use: Yes    Comment: occasional   Drug use: No    Mr.Crisci reports that he has been smoking cigarettes. He has a 43 pack-year smoking history. He uses smokeless tobacco. He reports current alcohol use. He reports  that he does not use drugs.  Tobacco Cessation: Ready to quit: Not Answered Counseling given: Not Answered Current Every Day Smoker I have counseled him to quit x 3-4 minutes, and have provided resources to help with quitting. See AVS  Past surgical hx, Family hx, Social hx all reviewed.  Current Outpatient Medications on File Prior to Visit  Medication Sig   ALPRAZolam (XANAX) 2 MG tablet take once 30 mins prior to procedure   amLODipine (NORVASC) 5 MG tablet Take 1 tablet (5 mg total) by mouth daily.   atorvastatin (LIPITOR) 20 MG tablet Take 1 tablet (20 mg total) by mouth daily.   buPROPion (WELLBUTRIN XL) 150 MG 24 hr tablet Take 1 tablet (150 mg total) by mouth daily.   pantoprazole (PROTONIX) 40 MG tablet Take 1 tablet (40 mg total) by mouth daily.   tadalafil (CIALIS) 20 MG tablet Take 1 tablet (20 mg total) by mouth daily as needed for erectile dysfunction.   valsartan-hydrochlorothiazide (DIOVAN-HCT) 160-25 MG tablet Take 1 tablet by mouth daily.   No current facility-administered medications on file prior to visit.     No Known Allergies  Review Of Systems:  Constitutional:   No  weight loss, night sweats,  Fevers, chills, fatigue, or  lassitude.  HEENT:   No headaches,  Difficulty swallowing,  Tooth/dental problems, or  Sore throat,                No sneezing, itching, ear ache, nasal congestion, post nasal drip,   CV:  No chest pain,  Orthopnea, PND, swelling in lower extremities, anasarca, dizziness, palpitations, syncope.   GI  No heartburn, indigestion, abdominal pain, nausea, vomiting, diarrhea, change in bowel habits, loss of appetite, bloody stools.   Resp: No shortness of breath with exertion or at rest.  No excess mucus, no productive cough,  No non-productive cough,  No coughing up of blood.  No change in color of mucus.  No wheezing.  No chest wall deformity  Skin: no rash or lesions.  GU: no dysuria, change in color of urine, no urgency or frequency.   No flank pain, no hematuria   MS:  No joint pain or swelling.  No decreased range of motion.  No back pain.  Psych:  No change in mood or affect. No depression or anxiety.  No memory loss.   Vital Signs BP 126/68 (BP Location: Left Arm, Patient Position: Sitting, Cuff Size: Large)   Pulse 73   Ht 5\' 8"  (1.727 m)   Wt 201 lb (91.2 kg)   SpO2 96%   BMI 30.56 kg/m    Physical Exam:  General- No distress,  A&Ox3, pleasant  ENT: No sinus tenderness, TM clear, pale nasal mucosa, no oral exudate,no post nasal drip, no LAN Cardiac: S1, S2, regular rate and rhythm, no murmur Chest: No wheeze/ rales/ dullness; no accessory muscle use, no nasal flaring, no sternal retractions Abd.: Soft Non-tender, ND, BS +, Body mass index is 30.56 kg/m.  Ext: No clubbing cyanosis, edema, no obvious deformities Neuro:  normal strength, MAE x 4, A&O x 3, pleasant Skin: No rashes, warm and dry, no obvious lesions  Psych: normal mood and behavior, anxious   Assessment/Plan PET Avid Pulmonary Nodule in current every day smoker 4B LDCT Plan We have reviewed the risks and benefits of the procedure. These include bleeding, infection, puncture of the lung, and adverse reaction to anesthesia. You have agreed to proceed with biopsy to evaluate the left upper lobe nodule. Your procedure will be done by Dr. Levy Pupa. You will receive a letter today with date time and information pertaining to the procedure. You will need someone to drive you to the procedure, stay with you during the procedure, and stay with you after the procedure. You will also need someone to stay with you for 24 hours after anesthesia to ensure you have cleared anesthesia and are doing well. You will follow-up with me 1 week after the procedure to review the results of the biopsy and to ensure you are doing well. Call if you need [place] prior to the procedure or if you have any questions at all. Please contact office for sooner follow up if  symptoms do not improve or worsen or seek emergency care     Please work on quitting smoking. I know this is hard, but it is in the best interest of your health.  You can receive free nicotine replacement therapy (patches, gum, or mints) by calling 1-800-QUIT NOW. Please call so we can get you on the path to becoming a non-smoker. I know it is hard, but you can do this!  Hypnosis for smoking cessation  [employer] [phone]  Acupuncture for smoking cessation  [employer] [phone]    I spent 40 minutes dedicated to the care of this patient on the date of this encounter to include pre-visit review of records, face-to-face time with the patient discussing conditions above, post visit ordering of testing, clinical documentation with the electronic health record, making appropriate referrals as documented, and communicating necessary information to the patient's healthcare team.   Bevelyn Ngo, NP 09/20/2023  8:35 AM

## 2023-09-20 NOTE — Patient Instructions (Addendum)
 Your PET scan did show some low level uptake in the right upper lobe pulmonary nodule, and in your right hilar lymph node. We will do a biopsy to better evaluate these findings. I have placed an order for a bronchoscopy with biopsies.  We have discussed the procedure in detail.  We have reviewed the risks and benefits of the procedure. These include bleeding, infection, puncture of the lung, and adverse reaction to anesthesia. You have agreed to proceed with biopsy to evaluate the left upper lobe nodule. Your procedure will be done by Dr. Levy Pupa. You will receive a letter today with date time and information pertaining to the procedure. You will need someone to drive you to the procedure, stay with you during the procedure, and stay with you after the procedure. You will also need someone to stay with you for 24 hours after anesthesia to ensure you have cleared anesthesia and are doing well. You will follow-up with me 1 week after the procedure to review the results of the biopsy and to ensure you are doing well. Call if you need [place] prior to the procedure or if you have any questions at all. Please contact office for sooner follow up if symptoms do not improve or worsen or seek emergency care     Please work on quitting smoking. I know this is hard, but it is in the best interest of your health.  You can receive free nicotine replacement therapy (patches, gum, or mints) by calling 1-800-QUIT NOW. Please call so we can get you on the path to becoming a non-smoker. I know it is hard, but you can do this!  Hypnosis for smoking cessation  [employer] [phone]  Acupuncture for smoking cessation  [employer] [phone]

## 2023-09-27 ENCOUNTER — Encounter (HOSPITAL_COMMUNITY): Payer: Self-pay | Admitting: Emergency Medicine

## 2023-09-27 NOTE — Progress Notes (Signed)
 INSTRUCTIONS ONLY  Anesthesia review: N  Patient verbally denies any shortness of breath, fever, cough and chest pain during phone call   -------------  SDW INSTRUCTIONS given:  Your procedure is scheduled on Monday, April 14th.  Report to [facility] Main Entrance "A" at 0800 A.M., and check in at the Admitting office.  Call this number if you have problems the morning of surgery:  [phone]   Remember:  Do not eat or drink after midnight the night before your surgery    Take these medicines the morning of surgery with A SIP OF WATER  amLODipine (NORVASC)  pantoprazole (PROTONIX)   As of today, STOP taking any Aspirin (unless otherwise instructed by your surgeon) Aleve, Naproxen, Ibuprofen, Motrin, Advil, Goody's, BC's, all herbal medications, fish oil, and all vitamins.                      Do not wear jewelry, make up, or nail polish            Do not wear lotions, powders, perfumes/colognes, or deodorant.            Do not shave 48 hours prior to surgery.  Men may shave face and neck.            Do not bring valuables to the hospital.            [facility] is not responsible for any belongings or valuables.  Do NOT Smoke (Tobacco/Vaping) 24 hours prior to your procedure If you use a CPAP at night, you may bring all equipment for your overnight stay.   Contacts, glasses, dentures or bridgework may not be worn into surgery.      For patients admitted to the hospital, discharge time will be determined by your treatment team.   Patients discharged the day of surgery will not be allowed to drive home, and someone needs to stay with them for 24 hours.    Special instructions:   [place]- Preparing For Surgery  Before surgery, you can play an important role. Because skin is not sterile, your skin needs to be as free of germs as possible. You can reduce the number of germs on your skin by washing with CHG (chlorahexidine gluconate) Soap before surgery.  CHG is an  antiseptic cleaner which kills germs and bonds with the skin to continue killing germs even after washing.    Oral Hygiene is also important to reduce your risk of infection.  Remember - BRUSH YOUR TEETH THE MORNING OF SURGERY WITH YOUR REGULAR TOOTHPASTE  Please do not use if you have an allergy to CHG or antibacterial soaps. If your skin becomes reddened/irritated stop using the CHG.  Do not shave (including legs and underarms) for at least 48 hours prior to first CHG shower. It is OK to shave your face.  Please follow these instructions carefully.   Shower the NIGHT BEFORE SURGERY and the MORNING OF SURGERY with DIAL Soap.   Pat yourself dry with a CLEAN TOWEL.  Wear CLEAN PAJAMAS to bed the night before surgery  Place CLEAN SHEETS on your bed the night of your first shower and DO NOT SLEEP WITH PETS.   Day of Surgery: Please shower morning of surgery  Wear Clean/Comfortable clothing the morning of surgery Do not apply any deodorants/lotions.   Remember to brush your teeth WITH YOUR REGULAR TOOTHPASTE.   Questions were answered. Patient verbalized understanding of instructions.

## 2023-09-30 ENCOUNTER — Ambulatory Visit (HOSPITAL_COMMUNITY)

## 2023-09-30 ENCOUNTER — Encounter (HOSPITAL_COMMUNITY)
Admission: RE | Disposition: A | Discharge status: Home / Self Care | Payer: Self-pay | Source: Home / Self Care | Attending: Emergency Medicine

## 2023-09-30 ENCOUNTER — Ambulatory Visit (HOSPITAL_COMMUNITY): Admitting: Anesthesiology

## 2023-09-30 ENCOUNTER — Encounter (HOSPITAL_COMMUNITY): Payer: Self-pay | Admitting: Emergency Medicine

## 2023-09-30 ENCOUNTER — Ambulatory Visit (HOSPITAL_COMMUNITY)
Admission: RE | Admit: 2023-09-30 | Discharge: 2023-09-30 | Disposition: A | Discharge status: Home / Self Care | Attending: Emergency Medicine | Admitting: Emergency Medicine

## 2023-09-30 DIAGNOSIS — R590 Localized enlarged lymph nodes: Secondary | ICD-10-CM | POA: Diagnosis present

## 2023-09-30 DIAGNOSIS — F17210 Nicotine dependence, cigarettes, uncomplicated: Secondary | ICD-10-CM | POA: Diagnosis not present

## 2023-09-30 DIAGNOSIS — Z79899 Other long term (current) drug therapy: Secondary | ICD-10-CM | POA: Insufficient documentation

## 2023-09-30 DIAGNOSIS — K219 Gastro-esophageal reflux disease without esophagitis: Secondary | ICD-10-CM | POA: Insufficient documentation

## 2023-09-30 DIAGNOSIS — E785 Hyperlipidemia, unspecified: Secondary | ICD-10-CM | POA: Diagnosis not present

## 2023-09-30 DIAGNOSIS — Z48813 Encounter for surgical aftercare following surgery on the respiratory system: Secondary | ICD-10-CM | POA: Diagnosis not present

## 2023-09-30 DIAGNOSIS — R911 Solitary pulmonary nodule: Secondary | ICD-10-CM | POA: Insufficient documentation

## 2023-09-30 DIAGNOSIS — I10 Essential (primary) hypertension: Secondary | ICD-10-CM | POA: Diagnosis not present

## 2023-09-30 DIAGNOSIS — C3411 Malignant neoplasm of upper lobe, right bronchus or lung: Secondary | ICD-10-CM | POA: Diagnosis not present

## 2023-09-30 HISTORY — PX: FINE NEEDLE ASPIRATION: SHX6590

## 2023-09-30 HISTORY — PX: BRONCHIAL NEEDLE ASPIRATION BIOPSY: SHX5106

## 2023-09-30 HISTORY — PX: BRONCHIAL BIOPSY: SHX5109

## 2023-09-30 HISTORY — DX: Hyperlipidemia, unspecified: E78.5

## 2023-09-30 HISTORY — DX: Gastro-esophageal reflux disease without esophagitis: K21.9

## 2023-09-30 HISTORY — PX: ENDOBRONCHIAL ULTRASOUND: SHX5096

## 2023-09-30 LAB — COMPREHENSIVE METABOLIC PANEL WITH GFR
ALT: 22 U/L (ref 0–44)
AST: 26 U/L (ref 15–41)
Albumin: 3.5 g/dL (ref 3.5–5.0)
Alkaline Phosphatase: 75 U/L (ref 38–126)
Anion gap: 7 (ref 5–15)
BUN: 20 mg/dL (ref 8–23)
CO2: 23 mmol/L (ref 22–32)
Calcium: 9.1 mg/dL (ref 8.9–10.3)
Chloride: 104 mmol/L (ref 98–111)
Creatinine, Ser: 1.06 mg/dL (ref 0.61–1.24)
GFR, Estimated: >60 mL/min (ref >60)
Glucose, Bld: 106 mg/dL — ABNORMAL HIGH (ref 70–99)
Potassium: 4.2 mmol/L (ref 3.5–5.1)
Sodium: 134 mmol/L — ABNORMAL LOW (ref 135–145)
Total Bilirubin: 0.9 mg/dL (ref 0.0–1.2)
Total Protein: 7.1 g/dL (ref 6.5–8.1)

## 2023-09-30 SURGERY — BRONCHOSCOPY, WITH BIOPSY USING ELECTROMAGNETIC NAVIGATION
Anesthesia: General | Laterality: Right

## 2023-09-30 MED ORDER — FENTANYL CITRATE (PF) 250 MCG/5ML IJ SOLN
INTRAMUSCULAR | Status: DC | PRN
  Administered 2023-09-30: 100 ug via INTRAVENOUS

## 2023-09-30 MED ORDER — PHENYLEPHRINE HCL (PRESSORS) 10 MG/ML IV SOLN
INTRAVENOUS | Status: DC | PRN
  Administered 2023-09-30 (×2): 80 ug via INTRAVENOUS
  Administered 2023-09-30 (×2): 160 ug via INTRAVENOUS
  Administered 2023-09-30: 80 ug via INTRAVENOUS

## 2023-09-30 MED ORDER — ONDANSETRON HCL 4 MG/2ML IJ SOLN
INTRAMUSCULAR | Status: DC | PRN
  Administered 2023-09-30: 4 mg via INTRAVENOUS

## 2023-09-30 MED ORDER — LACTATED RINGERS IV SOLN: INTRAVENOUS | Status: DC

## 2023-09-30 MED ORDER — FENTANYL CITRATE (PF) 100 MCG/2ML IJ SOLN: 25.0000 ug | INTRAMUSCULAR | Status: DC | PRN

## 2023-09-30 MED ORDER — OXYCODONE HCL 5 MG/5ML PO SOLN: 5.0000 mg | Freq: Once | ORAL | Status: DC | PRN

## 2023-09-30 MED ORDER — FENTANYL CITRATE (PF) 100 MCG/2ML IJ SOLN
INTRAMUSCULAR | Status: AC
  Filled 2023-09-30: qty 2

## 2023-09-30 MED ORDER — MIDAZOLAM HCL 2 MG/2ML IJ SOLN
INTRAMUSCULAR | Status: AC
  Filled 2023-09-30: qty 2

## 2023-09-30 MED ORDER — LIDOCAINE 2% (20 MG/ML) 5 ML SYRINGE
INTRAMUSCULAR | Status: DC | PRN
  Administered 2023-09-30: 80 mg via INTRAVENOUS

## 2023-09-30 MED ORDER — PHENYLEPHRINE 80 MCG/ML (10ML) SYRINGE FOR IV PUSH (FOR BLOOD PRESSURE SUPPORT)
PREFILLED_SYRINGE | INTRAVENOUS | Status: DC | PRN
  Administered 2023-09-30: 160 ug via INTRAVENOUS
  Administered 2023-09-30: 80 ug via INTRAVENOUS

## 2023-09-30 MED ORDER — ROCURONIUM BROMIDE 10 MG/ML (PF) SYRINGE
PREFILLED_SYRINGE | INTRAVENOUS | Status: DC | PRN
  Administered 2023-09-30: 80 mg via INTRAVENOUS

## 2023-09-30 MED ORDER — DEXAMETHASONE SODIUM PHOSPHATE 10 MG/ML IJ SOLN
INTRAMUSCULAR | Status: DC | PRN
  Administered 2023-09-30: 10 mg via INTRAVENOUS

## 2023-09-30 MED ORDER — ONDANSETRON HCL 4 MG/2ML IJ SOLN: 4.0000 mg | Freq: Once | INTRAMUSCULAR | Status: DC | PRN

## 2023-09-30 MED ORDER — CHLORHEXIDINE GLUCONATE 0.12 % MT SOLN
15.0000 mL | Freq: Once | OROMUCOSAL | Status: AC
Start: 2023-09-30 — End: 2023-09-30

## 2023-09-30 MED ORDER — OXYCODONE HCL 5 MG PO TABS: 5.0000 mg | ORAL_TABLET | Freq: Once | ORAL | Status: DC | PRN

## 2023-09-30 MED ORDER — MIDAZOLAM HCL 2 MG/2ML IJ SOLN
INTRAMUSCULAR | Status: DC | PRN
  Administered 2023-09-30: 2 mg via INTRAVENOUS

## 2023-09-30 MED ORDER — CHLORHEXIDINE GLUCONATE 0.12 % MT SOLN
OROMUCOSAL | Status: AC
  Administered 2023-09-30: 15 mL via OROMUCOSAL
  Filled 2023-09-30: qty 15

## 2023-09-30 MED ORDER — PROPOFOL 500 MG/50ML IV EMUL
INTRAVENOUS | Status: DC | PRN
  Administered 2023-09-30: 125 ug/kg/min via INTRAVENOUS

## 2023-09-30 MED ORDER — SUGAMMADEX SODIUM 200 MG/2ML IV SOLN
INTRAVENOUS | Status: DC | PRN
  Administered 2023-09-30: 200 mg via INTRAVENOUS

## 2023-09-30 MED ORDER — PROPOFOL 10 MG/ML IV BOLUS
INTRAVENOUS | Status: DC | PRN
  Administered 2023-09-30: 200 mg via INTRAVENOUS

## 2023-09-30 NOTE — Op Note (Signed)
 Video Bronchoscopy with Robotic Assisted Bronchoscopic Navigation and Endobronchial Ultrasound Procedure Note  Date of Operation: 09/30/2023   Pre-op Diagnosis: Right upper lobe nodule, hilar adenopathy  Post-op Diagnosis: Same  Surgeon: Racheal Buddle  Assistants: None  Anesthesia: General endotracheal anesthesia  Operation: Flexible video fiberoptic bronchoscopy with robotic assistance and biopsies.  Estimated Blood Loss: Minimal  Complications: None  Indications and History: Christian Howard is a 63 y.o. male with history of tobacco use.  Found to have a right upper lobe pulmonary nodule on screening CT scan of the chest with some hypermetabolism on subsequent PET scan.  There was also mild hypermetabolism in right hilar lymph node.  Recommendation made to achieve a tissue diagnosis via robotic assisted navigational bronchoscopy and endobronchial ultrasound with biopsies.  The risks, benefits, complications, treatment options and expected outcomes were discussed with the patient.  The possibilities of pneumothorax, pneumonia, reaction to medication, pulmonary aspiration, perforation of a viscus, bleeding, failure to diagnose a condition and creating a complication requiring transfusion or operation were discussed with the patient who freely signed the consent.    Description of Procedure: The patient was seen in the Preoperative Area, was examined and was deemed appropriate to proceed.  The patient was taken to [facility] endoscopy room 3, identified as Nosson Wender and the procedure verified as Flexible Video Fiberoptic Bronchoscopy.  A Time Out was held and the above information confirmed.   Prior to the date of the procedure a high-resolution CT scan of the chest was performed. Utilizing ION software program a virtual tracheobronchial tree was generated to allow the creation of distinct navigation pathways to the patient's parenchymal abnormalities. After being taken to the operating room general  anesthesia was initiated and the patient  was orally intubated. The video fiberoptic bronchoscope was introduced via the endotracheal tube and a general inspection was performed which showed normal right and left lung anatomy. Aspiration of the bilateral mainstems was completed to remove any remaining secretions. Robotic catheter inserted into patient's endotracheal tube.   Target #1 right upper lobe pulmonary nodule: The distinct navigation pathways prepared prior to this procedure were then utilized to navigate to patient's lesion identified on CT scan. The robotic catheter was secured into place and the vision probe was withdrawn.  Lesion location was approximated using fluoroscopy.  Local registration and targeting was performed using [employer] three-dimensional imaging.  Under fluoroscopic guidance transbronchial needle brushings, transbronchial needle biopsies, and transbronchial forceps biopsies were performed to be sent for cytology and pathology.    The robotic scope was then withdrawn and the endobronchial ultrasound was used to identify and characterize the peritracheal, hilar and bronchial lymph nodes. Inspection showed enlargement at station 10R, 4R, 7. Using real-time ultrasound guidance Wang needle biopsies were take from [employer], 4R, 7 nodes and were sent for cytology.    At the end of the procedure a general airway inspection was performed and there was no evidence of active bleeding. The bronchoscope was removed.  The patient tolerated the procedure well. There was no significant blood loss and there were no obvious complications. A post-procedural chest x-ray is pending.  Samples Target #1: 1. Transbronchial Wang needle biopsies from right upper lobe nodule 2. Transbronchial forceps biopsies from right upper lobe nodule   EBUS Samples: 1. Wang needle biopsies from 10R node 2. Wang needle biopsies from 4R node 3. Wang needle biopsies from 7 node  Plans:  The patient will be  discharged from the PACU to home when recovered from anesthesia  and after chest x-ray is reviewed. We will review the cytology, pathology and microbiology results with the patient when they become available. Outpatient followup will be with Braden Caddy, NP and Dr. Baldwin Levee.    Racheal Buddle, MD, PhD 09/30/2023, 11:27 AM [facility] [phone] or if no answer before 7:00PM call [phone] For any issues after 7:00PM please call eLink [phone]

## 2023-09-30 NOTE — Anesthesia Procedure Notes (Signed)
 Procedure Name: Intubation Date/Time: 09/30/2023 10:13 AM  Performed by: Basilio Limb, CRNAPre-anesthesia Checklist: Patient identified, Emergency Drugs available, Suction available and Patient being monitored Patient Re-evaluated:Patient Re-evaluated prior to induction Oxygen Delivery Method: Circle System Utilized Preoxygenation: Pre-oxygenation with 100% oxygen Induction Type: IV induction Ventilation: Mask ventilation without difficulty and Oral airway inserted - appropriate to patient size Laryngoscope Size: Mac and 4 Grade View: Grade I Tube type: Oral Tube size: 8.5 mm Number of attempts: 1 Airway Equipment and Method: Stylet and Oral airway Placement Confirmation: ETT inserted through vocal cords under direct vision, positive ETCO2 and breath sounds checked- equal and bilateral Secured at: 23 cm Tube secured with: Tape Dental Injury: Teeth and Oropharynx as per pre-operative assessment

## 2023-09-30 NOTE — Discharge Instructions (Addendum)
 Flexible Bronchoscopy, Care After This sheet gives you information about how to care for yourself after your test. Your doctor may also give you more specific instructions. If you have problems or questions, contact your doctor. Follow these instructions at home: Eating and drinking When your numbness is gone and your cough and gag reflexes have come back, you may: Eat only soft foods. Slowly drink liquids. When you get home after the test, go back to your normal diet. Driving Do not drive for 24 hours if you were given a medicine to help you relax (sedative). Do not drive or use heavy machinery while taking prescription pain medicine. General instructions  Take over-the-counter and prescription medicines only as told by your doctor. Return to your normal activities as told. Ask what activities are safe for you. Do not use any products that have nicotine or tobacco in them. This includes cigarettes and e-cigarettes. If you need help quitting, ask your doctor. Keep all follow-up visits as told by your doctor. This is important. It is very important if you had a tissue sample (biopsy) taken. Get help right away if: You have shortness of breath that gets worse. You get light-headed. You feel like you are going to pass out (faint). You have chest pain. You cough up: More than a little blood. More blood than before. Summary Do not eat or drink anything (not even water) for 2 hours after your test, or until your numbing medicine wears off. Do not use cigarettes. Do not use e-cigarettes. Get help right away if you have chest pain.  Please call our office for any questions or concerns.  [phone].  This information is not intended to replace advice given to you by your health care provider. Make sure you discuss any questions you have with your health care provider. Document Released: 04/01/2009 Document Revised: 05/17/2017 Document Reviewed: 06/22/2016 Elsevier Patient Education  2020  [employer].

## 2023-09-30 NOTE — Transfer of Care (Signed)
 Immediate Anesthesia Transfer of Care Note  Patient: Christian Howard  Procedure(s) Performed: BRONCHOSCOPY, WITH BIOPSY USING ELECTROMAGNETIC NAVIGATION (Right) ENDOBRONCHIAL ULTRASOUND (EBUS) (Right) BRONCHOSCOPY, WITH NEEDLE ASPIRATION BIOPSY BRONCHOSCOPY, WITH BIOPSY FINE NEEDLE ASPIRATION  Patient Location: PACU  Anesthesia Type:General  Level of Consciousness: awake, alert , and oriented  Airway & Oxygen Therapy: Patient Spontanous Breathing and Patient connected to face mask oxygen  Post-op Assessment: Report given to RN and Post -op Vital signs reviewed and stable  Post vital signs: Reviewed and stable  Last Vitals:  Vitals Value Taken Time  BP 124/68 09/30/23 1133  Temp    Pulse 79 09/30/23 1136  Resp 22 09/30/23 1136  SpO2 92 % 09/30/23 1136  Vitals shown include unfiled device data.  Last Pain:  Vitals:   09/30/23 0814  TempSrc: Oral         Complications: No notable events documented.

## 2023-09-30 NOTE — Interval H&P Note (Signed)
 History and Physical Interval Note:  09/30/2023 8:41 AM  Christian Howard  has presented today for surgery, with the diagnosis of LUNG NODULE AND LYMPHNODE RIGHT SIDE.  The various methods of treatment have been discussed with the patient and family. After consideration of risks, benefits and other options for treatment, the patient has consented to  Procedure(s) with comments: BRONCHOSCOPY, WITH BIOPSY USING ELECTROMAGNETIC NAVIGATION (Right) - WITH FLOURO ENDOBRONCHIAL ULTRASOUND (EBUS) (Right) as a surgical intervention.  The patient's history has been reviewed, patient examined, no change in status, stable for surgery.  I have reviewed the patient's chart and labs.  Questions were answered to the patient's satisfaction.     Denson Flake

## 2023-09-30 NOTE — Anesthesia Preprocedure Evaluation (Addendum)
 Anesthesia Evaluation  Patient identified by MRN, date of birth, ID band Patient awake    Reviewed: Allergy & Precautions, NPO status , Patient's Chart, lab work & pertinent test results  Airway Mallampati: II  TM Distance: >3 FB     Dental  (+) Edentulous Upper, Edentulous Lower   Pulmonary Current Smoker Pulmonary nodule Enlarged LN right lung   Pulmonary exam normal breath sounds clear to auscultation       Cardiovascular hypertension, Pt. on medications Normal cardiovascular exam Rhythm:Regular Rate:Normal     Neuro/Psych negative neurological ROS  negative psych ROS   GI/Hepatic PUD,GERD  Medicated,,(+)     substance abuse  cocaine use  Endo/Other  Hyperlipidemia  Renal/GU negative Renal ROS  negative genitourinary   Musculoskeletal negative musculoskeletal ROS (+)    Abdominal  (+) + obese  Peds  Hematology negative hematology ROS (+)   Anesthesia Other Findings   Reproductive/Obstetrics ED                             Anesthesia Physical Anesthesia Plan  ASA: 2  Anesthesia Plan: General   Post-op Pain Management: Minimal or no pain anticipated   Induction: Intravenous  PONV Risk Score and Plan: TIVA, Propofol infusion and Treatment may vary due to age or medical condition  Airway Management Planned: Oral ETT  Additional Equipment: None  Intra-op Plan:   Post-operative Plan: Extubation in OR  Informed Consent: I have reviewed the patients History and Physical, chart, labs and discussed the procedure including the risks, benefits and alternatives for the proposed anesthesia with the patient or authorized representative who has indicated his/her understanding and acceptance.     Dental advisory given  Plan Discussed with: Anesthesiologist and CRNA  Anesthesia Plan Comments:         Anesthesia Quick Evaluation

## 2023-09-30 NOTE — Anesthesia Postprocedure Evaluation (Signed)
 Anesthesia Post Note  Patient: Christian Howard  Procedure(s) Performed: BRONCHOSCOPY, WITH BIOPSY USING ELECTROMAGNETIC NAVIGATION (Right) ENDOBRONCHIAL ULTRASOUND (EBUS) (Right) BRONCHOSCOPY, WITH NEEDLE ASPIRATION BIOPSY BRONCHOSCOPY, WITH BIOPSY FINE NEEDLE ASPIRATION     Patient location during evaluation: PACU Anesthesia Type: General Level of consciousness: awake and alert and oriented Pain management: pain level controlled Vital Signs Assessment: post-procedure vital signs reviewed and stable Respiratory status: spontaneous breathing, nonlabored ventilation and respiratory function stable Cardiovascular status: blood pressure returned to baseline and stable Postop Assessment: no apparent nausea or vomiting Anesthetic complications: no   No notable events documented.  Last Vitals:  Vitals:   09/30/23 1145 09/30/23 1205  BP: 99/69 107/69  Pulse: 78 82  Resp: 18 20  Temp:  36.9 C  SpO2: 96% 94%    Last Pain:  Vitals:   09/30/23 1205  TempSrc:   PainSc: 0-No pain                 Kartel Wolbert A.

## 2023-10-01 LAB — CYTOLOGY - NON PAP

## 2023-10-02 ENCOUNTER — Encounter (HOSPITAL_COMMUNITY): Payer: Self-pay | Admitting: Emergency Medicine

## 2023-10-02 LAB — CYTOLOGY - NON PAP

## 2023-10-07 ENCOUNTER — Ambulatory Visit: Admitting: Acute Care

## 2023-10-07 NOTE — Progress Notes (Deleted)
 History of Present Illness Christian Howard is a 63 y.o. male every day smoker followed through the screening program , here for follow up of an  abnormal screening scan. He will be followed by Dr. Baldwin Levee.    10/07/2023 Pt. Presents for follow up after bronchoscopy 09/30/2023. He states he has done well. No bleeding, fever, discolored secretions or adverse reaction to anesthesia.   Test Results: Cytology 09/30/2023 FINAL MICROSCOPIC DIAGNOSIS:  A. LUNG, RUL, FINE NEEDLE ASPIRATION:  - Non-small cell carcinoma  - See comment   B. LYMPH NODE, 10R, FINE NEEDLE ASPIRATION:  - No malignant cells identified  - Lymphocytes present   C. LYMPH NODE, 4R, FINE NEEDLE ASPIRATION:  - No malignant cells identified  - Lymphocytes present   D. LYMPH NODE, 7, FINE NEEDLE ASPIRATION:  - No malignant cells identified  - Lymphocytes and benign bronchial cells present   PET scan 09/05/2023 Mild to moderate radiotracer uptake in the spiculated nodule described previously right upper lobe near the hilum of maximum SUV of 3.0. With this level uptake there is a differential including a low-grade malignancy. There is also a right hilar node which has some increased uptake as well. Considerations would be tissue sampling with the combination of these findings. Additional option would be short-term three-month follow-up as there is no more remote prior.   Other small lung nodules do not show abnormal uptake.     Latest Ref Rng & Units 04/11/2023    3:58 PM 02/08/2022    8:03 AM 01/20/2022    9:45 AM  CBC  WBC 4.0 - 10.5 K/uL 7.5  8.7  6.9   Hemoglobin 13.0 - 17.0 g/dL 16.1  09.6  04.5   Hematocrit 39.0 - 52.0 % 50.5  47.1  48.2   Platelets 150.0 - 400.0 K/uL 270.0  248.0  242        Latest Ref Rng & Units 09/30/2023    7:48 AM 04/11/2023    3:58 PM 02/08/2022    8:03 AM  BMP  Glucose 70 - 99 mg/dL 409  85  92   BUN 8 - 23 mg/dL 20  12  14    Creatinine 0.61 - 1.24 mg/dL 8.11  9.14  7.82    Sodium 135 - 145 mmol/L 134  135  135   Potassium 3.5 - 5.1 mmol/L 4.2  3.9  3.9   Chloride 98 - 111 mmol/L 104  103  105   CO2 22 - 32 mmol/L 23  23  23    Calcium  8.9 - 10.3 mg/dL 9.1  9.2  8.9     BNP No results found for: "BNP"  ProBNP No results found for: "PROBNP"  PFT No results found for: "FEV1PRE", "FEV1POST", "FVCPRE", "FVCPOST", "TLC", "DLCOUNC", "PREFEV1FVCRT", "PSTFEV1FVCRT"  DG Chest Port 1 View Result Date: 09/30/2023 CLINICAL DATA:  [id number] S/P bronchoscopy with biopsy [id number]. EXAM: PORTABLE CHEST 1 VIEW COMPARISON:  01/20/2022. FINDINGS: Mild diffuse increased interstitial markings, likely accentuated by low lung volume. No frank pulmonary edema. Bilateral lung fields are otherwise clear. No dense consolidation or lung collapse. Bilateral costophrenic angles are clear. Stable cardio-mediastinal silhouette. No acute osseous abnormalities. The soft tissues are within normal limits. IMPRESSION: *No active disease. Electronically Signed   By: Beula Brunswick M.D.   On: 09/30/2023 11:55   DG C-ARM BRONCHOSCOPY Result Date: 09/30/2023 C-ARM BRONCHOSCOPY: Fluoroscopy was utilized by the requesting physician.  No radiographic interpretation.     Past medical hx Past Medical History:  Diagnosis  Date   Blood transfusion without reported diagnosis 2001   states he had to have transfusion d/t gastric ulcer   Gastric ulcer with hemorrhage    GERD (gastroesophageal reflux disease)    Hyperlipidemia    Hypertension      Social History   Tobacco Use   Smoking status: Every Day    Current packs/day: 1.00    Average packs/day: 1 pack/day for 43.0 years (43.0 ttl pk-yrs)    Types: Cigarettes   Smokeless tobacco: Current  Vaping Use   Vaping status: Never Used  Substance Use Topics   Alcohol use: Yes    Comment: occasional   Drug use: No    Mr.Balsley reports that he has been smoking cigarettes. He has a 43 pack-year smoking history. He uses smokeless tobacco. He  reports current alcohol use. He reports that he does not use drugs.  Tobacco Cessation: Ready to quit: Not Answered Counseling given: Not Answered   Past surgical hx, Family hx, Social hx all reviewed.  Current Outpatient Medications on File Prior to Visit  Medication Sig   amLODipine  (NORVASC ) 5 MG tablet Take 1 tablet (5 mg total) by mouth daily.   atorvastatin  (LIPITOR) 20 MG tablet Take 1 tablet (20 mg total) by mouth daily.   buPROPion  (WELLBUTRIN  XL) 150 MG 24 hr tablet Take 1 tablet (150 mg total) by mouth daily.   pantoprazole  (PROTONIX ) 40 MG tablet Take 1 tablet (40 mg total) by mouth daily.   tadalafil  (CIALIS ) 20 MG tablet Take 1 tablet (20 mg total) by mouth daily as needed for erectile dysfunction.   valsartan -hydrochlorothiazide  (DIOVAN -HCT) 160-25 MG tablet Take 1 tablet by mouth daily.   No current facility-administered medications on file prior to visit.     No Known Allergies  Review Of Systems:  Constitutional:   No  weight loss, night sweats,  Fevers, chills, fatigue, or  lassitude.  HEENT:   No headaches,  Difficulty swallowing,  Tooth/dental problems, or  Sore throat,                No sneezing, itching, ear ache, nasal congestion, post nasal drip,   CV:  No chest pain,  Orthopnea, PND, swelling in lower extremities, anasarca, dizziness, palpitations, syncope.   GI  No heartburn, indigestion, abdominal pain, nausea, vomiting, diarrhea, change in bowel habits, loss of appetite, bloody stools.   Resp: No shortness of breath with exertion or at rest.  No excess mucus, no productive cough,  No non-productive cough,  No coughing up of blood.  No change in color of mucus.  No wheezing.  No chest wall deformity  Skin: no rash or lesions.  GU: no dysuria, change in color of urine, no urgency or frequency.  No flank pain, no hematuria   MS:  No joint pain or swelling.  No decreased range of motion.  No back pain.  Psych:  No change in mood or affect. No  depression or anxiety.  No memory loss.   Vital Signs There were no vitals taken for this visit.   Physical Exam:  General- No distress,  A&Ox3 ENT: No sinus tenderness, TM clear, pale nasal mucosa, no oral exudate,no post nasal drip, no LAN Cardiac: S1, S2, regular rate and rhythm, no murmur Chest: No wheeze/ rales/ dullness; no accessory muscle use, no nasal flaring, no sternal retractions Abd.: Soft Non-tender Ext: No clubbing cyanosis, edema Neuro:  normal strength Skin: No rashes, warm and dry Psych: normal mood and behavior  Assessment/Plan  No problem-specific Assessment & Plan notes found for this encounter.    [name], NP 10/07/2023  10:35 AM

## 2023-10-10 ENCOUNTER — Other Ambulatory Visit: Payer: Self-pay

## 2023-10-12 NOTE — Progress Notes (Signed)
 The proposed treatment discussed in conference is for discussion purpose only and is not a binding recommendation.  The patients have not been physically examined, or presented with their treatment options.  Therefore, final treatment plans cannot be decided.

## 2023-10-18 ENCOUNTER — Ambulatory Visit: Admitting: Acute Care

## 2023-10-18 ENCOUNTER — Encounter: Payer: Self-pay | Admitting: Acute Care

## 2023-10-18 VITALS — BP 112/71 | HR 83 | Ht 68.0 in | Wt 199.0 lb

## 2023-10-18 DIAGNOSIS — C3411 Malignant neoplasm of upper lobe, right bronchus or lung: Secondary | ICD-10-CM | POA: Diagnosis not present

## 2023-10-18 DIAGNOSIS — C3490 Malignant neoplasm of unspecified part of unspecified bronchus or lung: Secondary | ICD-10-CM

## 2023-10-18 DIAGNOSIS — F17200 Nicotine dependence, unspecified, uncomplicated: Secondary | ICD-10-CM

## 2023-10-18 DIAGNOSIS — F17210 Nicotine dependence, cigarettes, uncomplicated: Secondary | ICD-10-CM | POA: Diagnosis not present

## 2023-10-18 DIAGNOSIS — R918 Other nonspecific abnormal finding of lung field: Secondary | ICD-10-CM

## 2023-10-18 MED ORDER — ALPRAZOLAM 0.25 MG PO TABS: 0.5000 mg | ORAL_TABLET | Freq: Once | ORAL | 0 refills | Status: AC

## 2023-10-18 NOTE — Patient Instructions (Addendum)
 It is good to see you today. I am glad you have done well after the bronchoscopy and biopsies. The biopsy of the Right upper lobe nodule we were concerned about was positive for non small lung cancer. The lymph nodes we biopsies were negative for cancer. I have referred you for consultation with radiation oncology and thoracic surgery. You will get calls to get these appointments scheduled. You will need a PFT's to ensure you are a surgical candidate I have also ordered an MRI Brain to complete staging. You will get calls to schedule these procedures. I have sent in xanax  0.5 mg times one dose . Take 30 minutes before the scan. Do  not drive after taking this medication. Please work hard on quitting smoking entirely.  You can receive free nicotine replacement therapy ( patches, gum or mints) by calling 1-800-QUIT NOW. Please call so we can get you on the path to becoming  a non-smoker. I know it is hard, but you can do this!  Other options for assistance in smoking cessation ( As covered by your insurance benefits)  Hypnosis for smoking cessation  [employer] [phone]  Acupuncture for smoking cessation  [employer] [phone]

## 2023-10-18 NOTE — Progress Notes (Addendum)
 History of Present Illness Christian Howard is a 64 y.o. male  every day smoker followed through the screening program , here for follow up of an  abnormal screening scan. He will be followed by Dr. Shelah.   Synopsis Pt had an abnormal lung cancer screening scan 07/25/2023. This was read as a 4 B. There was a  Spiculated 11 mm right upper lobe pulmonary nodule is highly suspicious for primary bronchogenic carcinoma. He was called with scan results and scheduled for a PET scan to better evaluate the nodule. PET scan was done 09/05/2023, and showed Mild to moderate radiotracer uptake in the spiculated nodule described previously right upper lobe near the hilum of maximum SUV of 3.0. With this level uptake there is a differential including a low-grade malignancy. There is also a right hilar node which has some increased uptake as well.  Pt. Was scheduled for bronchoscopy with biopsies on 09/30/2023. He is here today with his wife to review the results of the cytology and to ensure he has done well after the procedure.   Pt. Has consented to use of [employer] soft wear to help capture the content of this OV .  10/18/2023 Pt. presents for follow-up after bronchoscopy with biopsies on 09/30/2023.  Patient states he has been doing well.  He did endorse some scant bleeding for 1-1/2 days after the procedure was completed.  He stated this bleeding was bright red blood but scant in amount and again has self resolved. He also endorsed with a sore throat which has resolved.  We discussed the results of the patient's biopsy.  The right upper lobe pulmonary nodule biopsy was positive for a non-small cell carcinoma.  The 10R, 4R, and 7 lymph node were negative for malignant cells. We discussed that the best options for treatment were consideration of surgery to remove the nodule and also possibly radiation oncology for radiation treatment.  Patient is a current everyday smoker and I explained he would have to be evaluated  with pulmonary function testing to determine if he is a surgical candidate. Both patient and his wife have agreed the best option is to be referred to surgery but also to have consult with radiation oncology in the event that the patient did not qualify for surgery. Patient has been counseled extensively today to quit smoking.  He understands that this could possibly be a barrier in him having surgery even if he qualifies based on PFTs. Orders have been placed for referral to radiation oncology and to thoracic surgery. An order for pulmonary function testing has been placed I have also placed an order for MRI brain to complete staging Patient and his wife had no further questions at completion of the office visit.  I have asked him to call if they have any further questions or concerns.   Test Results: Cytology 09/30/2023 FINAL MICROSCOPIC DIAGNOSIS:  A. LUNG, RUL, FINE NEEDLE ASPIRATION:  - Non-small cell carcinoma  - See comment   The cytologic features are most consistent with adenocarcinoma.  There  is scant tumor cellularity in the cellblock.  Immunohistochemistry will  be attempted and reported as an addendum.   ADDENDUM:  Immunohistochemistry is performed on the cellblock and there are rare atypical cells which are positive with TTF-1 and negative with p40 suggesting adenocarcinoma; however, the amount of atypical cellularity in the cellblock is markedly limited and the TTF-1 positive cells may represent reactive bronchial cells.   B. LYMPH NODE, 10R, FINE NEEDLE ASPIRATION:  -  No malignant cells identified  - Lymphocytes present   C. LYMPH NODE, 4R, FINE NEEDLE ASPIRATION:  - No malignant cells identified  - Lymphocytes present   D. LYMPH NODE, 7, FINE NEEDLE ASPIRATION:  - No malignant cells identified  - Lymphocytes and benign bronchial cells present      Latest Ref Rng & Units 04/11/2023    3:58 PM 02/08/2022    8:03 AM 01/20/2022    9:45 AM  CBC  WBC 4.0 - 10.5  K/uL 7.5  8.7  6.9   Hemoglobin 13.0 - 17.0 g/dL 83.4  84.0  82.7   Hematocrit 39.0 - 52.0 % 50.5  47.1  48.2   Platelets 150.0 - 400.0 K/uL 270.0  248.0  242        Latest Ref Rng & Units 09/30/2023    7:48 AM 04/11/2023    3:58 PM 02/08/2022    8:03 AM  BMP  Glucose 70 - 99 mg/dL 893  85  92   BUN 8 - 23 mg/dL 20  12  14    Creatinine 0.61 - 1.24 mg/dL 8.93  8.91  8.93   Sodium 135 - 145 mmol/L 134  135  135   Potassium 3.5 - 5.1 mmol/L 4.2  3.9  3.9   Chloride 98 - 111 mmol/L 104  103  105   CO2 22 - 32 mmol/L 23  23  23    Calcium  8.9 - 10.3 mg/dL 9.1  9.2  8.9     BNP No results found for: BNP  ProBNP No results found for: PROBNP  PFT No results found for: FEV1PRE, FEV1POST, FVCPRE, FVCPOST, TLC, DLCOUNC, PREFEV1FVCRT, PSTFEV1FVCRT  DG Chest Port 1 View Result Date: 09/30/2023 CLINICAL DATA:  [id number] S/P bronchoscopy with biopsy [id number]. EXAM: PORTABLE CHEST 1 VIEW COMPARISON:  01/20/2022. FINDINGS: Mild diffuse increased interstitial markings, likely accentuated by low lung volume. No frank pulmonary edema. Bilateral lung fields are otherwise clear. No dense consolidation or lung collapse. Bilateral costophrenic angles are clear. Stable cardio-mediastinal silhouette. No acute osseous abnormalities. The soft tissues are within normal limits. IMPRESSION: *No active disease. Electronically Signed   By: Ree Molt M.D.   On: 09/30/2023 11:55   DG C-ARM BRONCHOSCOPY Result Date: 09/30/2023 C-ARM BRONCHOSCOPY: Fluoroscopy was utilized by the requesting physician.  No radiographic interpretation.     Past medical hx Past Medical History:  Diagnosis Date   Blood transfusion without reported diagnosis 2001   states he had to have transfusion d/t gastric ulcer   Gastric ulcer with hemorrhage    GERD (gastroesophageal reflux disease)    Hyperlipidemia    Hypertension      Social History   Tobacco Use   Smoking status: Every Day    Current  packs/day: 1.00    Average packs/day: 1 pack/day for 43.0 years (43.0 ttl pk-yrs)    Types: Cigarettes   Smokeless tobacco: Current   Tobacco comments:    I pack a day-10/18/2023-KRD  Vaping Use   Vaping status: Never Used  Substance Use Topics   Alcohol use: Yes    Comment: occasional   Drug use: No    Mr.Bahena reports that he has been smoking cigarettes. He has a 43 pack-year smoking history. He uses smokeless tobacco. He reports current alcohol use. He reports that he does not use drugs.  Tobacco Cessation: Ready to quit: Not Answered Counseling given: Not Answered Tobacco comments: I pack a day-10/18/2023-KRD Current everyday smoker is smoking 1 pack/day Patient has a  43-pack-year smoking history Counseled x 3 minutes on smoking cessation Past surgical hx, Family hx, Social hx all reviewed.  Current Outpatient Medications on File Prior to Visit  Medication Sig   amLODipine  (NORVASC ) 5 MG tablet Take 1 tablet (5 mg total) by mouth daily.   atorvastatin  (LIPITOR) 20 MG tablet Take 1 tablet (20 mg total) by mouth daily.   buPROPion  (WELLBUTRIN  XL) 150 MG 24 hr tablet Take 1 tablet (150 mg total) by mouth daily.   pantoprazole  (PROTONIX ) 40 MG tablet Take 1 tablet (40 mg total) by mouth daily.   tadalafil  (CIALIS ) 20 MG tablet Take 1 tablet (20 mg total) by mouth daily as needed for erectile dysfunction.   valsartan -hydrochlorothiazide  (DIOVAN -HCT) 160-25 MG tablet Take 1 tablet by mouth daily.   No current facility-administered medications on file prior to visit.     No Known Allergies  Review Of Systems:  Constitutional:   No  weight loss, night sweats,  Fevers, chills, fatigue, or  lassitude.  HEENT:   No headaches,  Difficulty swallowing,  Tooth/dental problems, or  Sore throat,                No sneezing, itching, ear ache, nasal congestion, post nasal drip,   CV:  No chest pain,  Orthopnea, PND, swelling in lower extremities, anasarca, dizziness, palpitations,  syncope.   GI  No heartburn, indigestion, abdominal pain, nausea, vomiting, diarrhea, change in bowel habits, loss of appetite, bloody stools.   Resp: No shortness of breath with exertion or at rest.  No excess mucus, no productive cough,  No non-productive cough,  No coughing up of blood.  No change in color of mucus.  No wheezing.  No chest wall deformity  Skin: no rash or lesions.  GU: no dysuria, change in color of urine, no urgency or frequency.  No flank pain, no hematuria   MS:  No joint pain or swelling.  No decreased range of motion.  No back pain.  Psych:  No change in mood or affect. No depression or anxiety.  No memory loss.   Vital Signs BP 112/71 (BP Location: Left Arm, Patient Position: Sitting, Cuff Size: Large)   Pulse 83   Ht 5' 8 (1.727 m)   Wt 199 lb (90.3 kg)   SpO2 96%   BMI 30.26 kg/m    Physical Exam:  General- No distress,  A&Ox3, pleasant and appropriately concerned ENT: No sinus tenderness, TM clear, pale nasal mucosa, no oral exudate,no post nasal drip, no LAN Cardiac: S1, S2, regular rate and rhythm, no murmur Chest: No wheeze/ rales/ dullness; no accessory muscle use, no nasal flaring, no sternal retractions Abd.: Soft Non-tender, nondistended, bowel sounds positive,Body mass index is 30.26 kg/m.  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 - Order pulmonary function test to assess surgical candidacy. - Refer to thoracic surgery for surgical evaluation. - Refer to radiation oncology for evaluation of SBRT. - Prescribe Xanax  0.5 mg for MRI to manage claustrophobia. - Counseled extensively on smoking  cessation  Smoking dependence Continues smoking one pack per day. Smoking cessation critical for treatment outcomes and surgical candidacy. Discussed cessation aids. - Encourage use  of nicotine replacement therapy such as patches, gum, or mints. - Discuss hypnosis and acupuncture as potential aids for smoking cessation. - Advise to actively work on smoking cessation to improve surgical candidacy.  I spent 40 minutes dedicated to the care of this patient on the date of this encounter to include pre-visit review of records, face-to-face time with the patient discussing conditions above, post visit ordering of testing, clinical documentation with the electronic health record, making appropriate referrals as documented, and communicating necessary information to the patient's healthcare team.   Lauraine JULIANNA Lites, NP 10/18/2023  9:41 AM

## 2023-10-20 ENCOUNTER — Other Ambulatory Visit: Payer: Self-pay | Admitting: Internal Medicine

## 2023-10-20 DIAGNOSIS — E782 Mixed hyperlipidemia: Secondary | ICD-10-CM

## 2023-10-20 DIAGNOSIS — I10 Essential (primary) hypertension: Secondary | ICD-10-CM

## 2023-10-21 ENCOUNTER — Telehealth: Payer: Self-pay | Admitting: Radiation Oncology

## 2023-10-21 NOTE — Telephone Encounter (Signed)
LVM to schedule CON with Dr. Kinard 

## 2023-10-21 NOTE — Telephone Encounter (Signed)
 LVM for pt's spouse asking to reach pt to schedule CON with Dr. Eloise Hake

## 2023-10-21 NOTE — Telephone Encounter (Signed)
 Pt's wife called back and we were able to schedule CON with Dr. Eloise Hake 5/15 @ 1:30 pm.

## 2023-10-30 NOTE — Progress Notes (Signed)
 Location of tumor and Histology per Pathology Report: Right upper lobe  Biopsy:    Past/Anticipated interventions by surgeon, if any: right    Past/Anticipated interventions by medical oncology, if any: None at this time    Pain issues, if any:  no   SAFETY ISSUES: Prior radiation? no Pacemaker/ICD? no Possible current pregnancy? no Is the patient on methotrexate? no   Current Complaints / other details:     BP (!) 141/77 (BP Location: Left Arm, Patient Position: Sitting)   Pulse 84   Temp 97.8 F (36.6 C)   Resp 18   Ht 5\' 8"  (1.727 m)   Wt 199 lb 3.2 oz (90.4 kg)   SpO2 98%   BMI 30.29 kg/m

## 2023-10-30 NOTE — Progress Notes (Signed)
 Radiation Oncology         [phone] ________________________________  Initial {In/Out}patient Consultation  Name: Christian Howard MRN: [id number]  Date: 10/31/2023  DOB: [birth year]  HK:[id number] Fran Imus, MD  Denson Flake, MD   REFERRING PHYSICIAN: Denson Flake, MD  DIAGNOSIS: There were no encounter diagnoses.  Non-small cell carcinoma of right lung    Cancer Staging  No matching staging information was found for the patient.   HISTORY OF PRESENT ILLNESS::Christian Howard is a 63 y.o. male who is accompanied by ***. he is seen as a courtesy of Dr. Byrum for an opinion concerning radiation therapy as part of management for his recently diagnosed lung cancer. Patient is an every day smoker followed through the lung cancer screening program.     The patient presented for a routine CT chest on 07/25/23 revealing a suspicious 11 mm right upper lobe pulmonary nodule along with a right lower lobe pulmonary nodule is favored to represent a benign granuloma or hamartoma.   In light of findings, he underwent a PET scan on 09/05/23 showing a mild to moderate radiotracer uptake in the spiculated nodule described previously right upper lobe near the hilum measuring 11 x 7 mm suggestive of low-grade malignancy. There is also a right hilar node which has some increased uptake as well. Considerations would be tissue sampling with the combination of these findings     Subsequently, patient underwent a bronchoscopy with electromagnetic navigation and EBUS on 09/30/23 under the care of Dr. Baldwin Levee. Surgical pathology of the right upper lobe pulmonary nodule biopsy indicated non-small cell carcinoma- suggestive of adenocarcinoma. All examined lymph nodes are negative for invasive disease.      Immunohistochemistry is performed on the cellblock and there are rare atypical cells which are positive with TTF-1 and negative with p40 suggesting adenocarcinoma; however, the amount of atypical cellularity  in the cellblock is markedly limited and the TTF-1 positive cells may represent reactive bronchial cells.   During his most recent visit with NP Groce, he reported recovering from procedure well. During the visit, they discussed further treatment plan including surgery for nodule removal followed radiation therapy.     PREVIOUS RADIATION THERAPY: No  PAST MEDICAL HISTORY:  Past Medical History:  Diagnosis Date   Blood transfusion without reported diagnosis 2001   states he had to have transfusion d/t gastric ulcer   Gastric ulcer with hemorrhage    GERD (gastroesophageal reflux disease)    Hyperlipidemia    Hypertension     PAST SURGICAL HISTORY: Past Surgical History:  Procedure Laterality Date   BRONCHIAL BIOPSY  09/30/2023   Procedure: BRONCHOSCOPY, WITH BIOPSY;  Surgeon: Denson Flake, MD;  Location: [facility] ENDOSCOPY;  Service: Pulmonary;;   BRONCHIAL NEEDLE ASPIRATION BIOPSY  09/30/2023   Procedure: BRONCHOSCOPY, WITH NEEDLE ASPIRATION BIOPSY;  Surgeon: Denson Flake, MD;  Location: [facility] ENDOSCOPY;  Service: Pulmonary;;   ENDOBRONCHIAL ULTRASOUND Right 09/30/2023   Procedure: ENDOBRONCHIAL ULTRASOUND (EBUS);  Surgeon: Denson Flake, MD;  Location[facility] ENDOSCOPY;  Service: Pulmonary;  Laterality: Right;   FINE NEEDLE ASPIRATION  09/30/2023   Procedure: FINE NEEDLE ASPIRATION;  Surgeon: Denson Flake, MD;  Location: [facility] ENDOSCOPY;  Service: Pulmonary;;   WRIST SURGERY Right 1989    FAMILY HISTORY:  Family History  Problem Relation Age of Onset   Colon polyps Neg Hx    Colon cancer Neg Hx    Esophageal cancer Neg Hx    Stomach cancer Neg Hx  Rectal cancer Neg Hx     SOCIAL HISTORY:  Social History   Tobacco Use   Smoking status: Every Day    Current packs/day: 1.00    Average packs/day: 1 pack/day for 43.0 years (43.0 ttl pk-yrs)    Types: Cigarettes   Smokeless tobacco: Current   Tobacco comments:    I pack a day-10/18/2023-KRD  Vaping Use   Vaping status: Never  Used  Substance Use Topics   Alcohol use: Yes    Comment: occasional   Drug use: No    ALLERGIES: No Known Allergies  MEDICATIONS:  Current Outpatient Medications  Medication Sig Dispense Refill   amLODipine  (NORVASC ) 5 MG tablet Take 1 tablet (5 mg total) by mouth daily. 90 tablet 1   atorvastatin  (LIPITOR) 20 MG tablet Take 1 tablet by mouth once daily 90 tablet 1   buPROPion  (WELLBUTRIN  XL) 150 MG 24 hr tablet Take 1 tablet (150 mg total) by mouth daily. 90 tablet 1   pantoprazole  (PROTONIX ) 40 MG tablet Take 1 tablet (40 mg total) by mouth daily. 90 tablet 1   tadalafil  (CIALIS ) 20 MG tablet Take 1 tablet (20 mg total) by mouth daily as needed for erectile dysfunction. 10 tablet 2   valsartan -hydrochlorothiazide  (DIOVAN -HCT) 160-25 MG tablet Take 1 tablet by mouth once daily 90 tablet 1   No current facility-administered medications for this encounter.    REVIEW OF SYSTEMS:  A 10+ POINT REVIEW OF SYSTEMS WAS OBTAINED including neurology, dermatology, psychiatry, cardiac, respiratory, lymph, extremities, GI, GU, musculoskeletal, constitutional, reproductive, HEENT. ***   PHYSICAL EXAM:  vitals were not taken for this visit.   General: Alert and oriented, in no acute distress HEENT: Head is normocephalic. Extraocular movements are intact. Oropharynx is clear. Neck: Neck is supple, no palpable cervical or supraclavicular lymphadenopathy. Heart: Regular in rate and rhythm with no murmurs, rubs, or gallops. Chest: Clear to auscultation bilaterally, with no rhonchi, wheezes, or rales. Abdomen: Soft, nontender, nondistended, with no rigidity or guarding. Extremities: No cyanosis or edema. Lymphatics: see Neck Exam Skin: No concerning lesions. Musculoskeletal: symmetric strength and muscle tone throughout. Neurologic: Cranial nerves II through XII are grossly intact. No obvious focalities. Speech is fluent. Coordination is intact. Psychiatric: Judgment and insight are intact. Affect  is appropriate. ***  ECOG = ***  0 - Asymptomatic (Fully active, able to carry on all predisease activities without restriction)  1 - Symptomatic but completely ambulatory (Restricted in physically strenuous activity but ambulatory and able to carry out work of a light or sedentary nature. For example, light housework, office work)  2 - Symptomatic, <50% in bed during the day (Ambulatory and capable of all self care but unable to carry out any work activities. Up and about more than 50% of waking hours)  3 - Symptomatic, >50% in bed, but not bedbound (Capable of only limited self-care, confined to bed or chair 50% or more of waking hours)  4 - Bedbound (Completely disabled. Cannot carry on any self-care. Totally confined to bed or chair)  5 - Death   Aurea Blossom MM, Creech RH, Tormey DC, et al. [phone]). "Toxicity and response criteria of the [facility]". Am. Hillard Lowes. Oncol. 5 (6): 649-55  LABORATORY DATA:  Lab Results  Component Value Date   WBC 7.5 04/11/2023   HGB 16.5 04/11/2023   HCT 50.5 04/11/2023   MCV 92.6 04/11/2023   PLT 270.0 04/11/2023   NEUTROABS 4.3 04/11/2023   Lab Results  Component Value Date  NA 134 (L) 09/30/2023   K 4.2 09/30/2023   CL 104 09/30/2023   CO2 23 09/30/2023   GLUCOSE 106 (H) 09/30/2023   BUN 20 09/30/2023   CREATININE 1.06 09/30/2023   CALCIUM  9.1 09/30/2023      RADIOGRAPHY: No results found.    IMPRESSION: Non-small cell carcinoma of right lung   ***  Today, I talked to the patient and family about the findings and work-up thus far.  We discussed the natural history of *** and general treatment, highlighting the role of radiotherapy in the management.  We discussed the available radiation techniques, and focused on the details of logistics and delivery.  We reviewed the anticipated acute and late sequelae associated with radiation in this setting.  The patient was encouraged to ask questions that I answered to the  best of my ability. *** A patient consent form was discussed and signed.  We retained a copy for our records.  The patient would like to proceed with radiation and will be scheduled for CT simulation.  PLAN: ***    *** minutes of total time was spent for this patient encounter, including preparation, face-to-face counseling with the patient and coordination of care, physical exam, and documentation of the encounter.   ------------------------------------------------  Noralee Beam, PhD, MD  This document serves as a record of services personally performed by Retta Caster, MD. It was created on his behalf by Lucky Sable, a trained medical scribe. The creation of this record is based on the scribe's personal observations and the provider's statements to them. This document has been checked and approved by the attending provider.

## 2023-10-30 NOTE — Progress Notes (Unsigned)
 [address]       [place] [address]             [phone]                    Callie Cater [facility] Medical Record #[id number] Date of Birth: [birth year]  Referring: Zilphia Hilt, Achilles Achilles* Primary Care: Zilphia Hilt, Charyl Coppersmith, MD Primary Cardiologist: None  Chief Complaint:   No chief complaint on file.   History of Present Illness:    Christian Howard is a 63 y.o. male who presents for surgical evaluation of a ***   Synopsis Pt had an abnormal lung cancer screening scan 07/25/2023. This was read as a 4 B. There was a  Spiculated 11 mm right upper lobe pulmonary nodule is highly suspicious for primary bronchogenic carcinoma. He was called with scan results and scheduled for a PET scan to better evaluate the nodule. PET scan was done 09/05/2023, and showed Mild to moderate radiotracer uptake in the spiculated nodule described previously right upper lobe near the hilum of maximum SUV of 3.0. With this level uptake there is a differential including a low-grade malignancy. There is also a right hilar node which has some increased uptake as well.  Pt. Was scheduled for bronchoscopy with biopsies on 09/30/2023. He is here today with his wife to review the results of the cytology and to ensure he has done well after the procedure.    Smoking Hx: ***      Past Medical History:  Diagnosis Date   Blood transfusion without reported diagnosis 2001   states he had to have transfusion d/t gastric ulcer   Gastric ulcer with hemorrhage    GERD (gastroesophageal reflux disease)    Hyperlipidemia    Hypertension     Past Surgical History:  Procedure Laterality Date   BRONCHIAL BIOPSY  09/30/2023   Procedure: BRONCHOSCOPY, WITH BIOPSY;  Surgeon: Denson Flake, MD;  Location: [facility] ENDOSCOPY;  Service: Pulmonary;;   BRONCHIAL NEEDLE ASPIRATION BIOPSY  09/30/2023   Procedure: BRONCHOSCOPY, WITH NEEDLE ASPIRATION BIOPSY;  Surgeon: Denson Flake, MD;  Location: [facility]  ENDOSCOPY;  Service: Pulmonary;;   ENDOBRONCHIAL ULTRASOUND Right 09/30/2023   Procedure: ENDOBRONCHIAL ULTRASOUND (EBUS);  Surgeon: Denson Flake, MD;  Location: [facility] ENDOSCOPY;  Service: Pulmonary;  Laterality: Right;   FINE NEEDLE ASPIRATION  09/30/2023   Procedure: FINE NEEDLE ASPIRATION;  Surgeon: Denson Flake, MD;  Location: [facility] ENDOSCOPY;  Service: Pulmonary;;   WRIST SURGERY Right 1989    Family History  Problem Relation Age of Onset   Colon polyps Neg Hx    Colon cancer Neg Hx    Esophageal cancer Neg Hx    Stomach cancer Neg Hx    Rectal cancer Neg Hx      Social History   Tobacco Use  Smoking Status Every Day   Current packs/day: 1.00   Average packs/day: 1 pack/day for 43.0 years (43.0 ttl pk-yrs)   Types: Cigarettes  Smokeless Tobacco Current  Tobacco Comments   I pack a day-10/18/2023-KRD    Social History   Substance and Sexual Activity  Alcohol Use Yes   Comment: occasional     No Known Allergies  Current Outpatient Medications  Medication Sig Dispense Refill   amLODipine  (NORVASC ) 5 MG tablet Take 1 tablet (5 mg total) by mouth daily. 90 tablet 1   atorvastatin  (LIPITOR) 20 MG tablet Take 1 tablet by mouth once daily 90  tablet 1   buPROPion  (WELLBUTRIN  XL) 150 MG 24 hr tablet Take 1 tablet (150 mg total) by mouth daily. 90 tablet 1   pantoprazole  (PROTONIX ) 40 MG tablet Take 1 tablet (40 mg total) by mouth daily. 90 tablet 1   tadalafil  (CIALIS ) 20 MG tablet Take 1 tablet (20 mg total) by mouth daily as needed for erectile dysfunction. 10 tablet 2   valsartan -hydrochlorothiazide  (DIOVAN -HCT) 160-25 MG tablet Take 1 tablet by mouth once daily 90 tablet 1   No current facility-administered medications for this visit.    ROS   PHYSICAL EXAMINATION: There were no vitals taken for this visit. Physical Exam       I have independently reviewed the above radiology studies  and reviewed the findings with the patient.   Recent Lab Findings: Lab  Results  Component Value Date   WBC 7.5 04/11/2023   HGB 16.5 04/11/2023   HCT 50.5 04/11/2023   PLT 270.0 04/11/2023   GLUCOSE 106 (H) 09/30/2023   CHOL 193 04/11/2023   TRIG 97.0 04/11/2023   HDL 41.40 04/11/2023   LDLCALC 132 (H) 04/11/2023   ALT 22 09/30/2023   AST 26 09/30/2023   NA 134 (L) 09/30/2023   K 4.2 09/30/2023   CL 104 09/30/2023   CREATININE 1.06 09/30/2023   BUN 20 09/30/2023   CO2 23 09/30/2023   TSH 0.71 02/08/2022   HGBA1C 5.8 (A) 07/04/2023    Diagnostic Studies & Laboratory data:     Recent Radiology Findings:   DG Chest Port 1 View Result Date: 09/30/2023 CLINICAL DATA:  [id number] S/P bronchoscopy with biopsy [id number]. EXAM: PORTABLE CHEST 1 VIEW COMPARISON:  01/20/2022. FINDINGS: Mild diffuse increased interstitial markings, likely accentuated by low lung volume. No frank pulmonary edema. Bilateral lung fields are otherwise clear. No dense consolidation or lung collapse. Bilateral costophrenic angles are clear. Stable cardio-mediastinal silhouette. No acute osseous abnormalities. The soft tissues are within normal limits. IMPRESSION: *No active disease. Electronically Signed   By: Beula Brunswick M.D.   On: 09/30/2023 11:55   DG C-ARM BRONCHOSCOPY Result Date: 09/30/2023 C-ARM BRONCHOSCOPY: Fluoroscopy was utilized by the requesting physician.  No radiographic interpretation.     PFTs:  - FVC: ***% - FEV1: ***% -DLCO: ***%  FINAL MICROSCOPIC DIAGNOSIS:  B. LYMPH NODE, 10R, FINE NEEDLE ASPIRATION:  - No malignant cells identified  - Lymphocytes present   C. LYMPH NODE, 4R, FINE NEEDLE ASPIRATION:  - No malignant cells identified  - Lymphocytes present   D. LYMPH NODE, 7, FINE NEEDLE ASPIRATION:  - No malignant cells identified  - Lymphocytes and benign bronchial cells present   FINAL MICROSCOPIC DIAGNOSIS:  A. LUNG, RUL, FINE NEEDLE ASPIRATION:  - Non-small cell carcinoma  - See comment     Assessment / Plan:   63 y.o. male with a right  upper lobe 11mm NSCLC.  PFTs pending.  PET CT show hilar avidity, but biopsy was negative for metastasis.  ***     I  spent {CHL ONC TIME VISIT - XBJYN:[id number]} with  the patient face to face in counseling and coordination of care.    Hilarie Lovely 10/30/2023 9:57 AM

## 2023-10-31 ENCOUNTER — Ambulatory Visit
Admission: RE | Admit: 2023-10-31 | Discharge: 2023-10-31 | Disposition: A | Discharge status: Home / Self Care | Source: Ambulatory Visit | Attending: Radiation Oncology | Admitting: Radiation Oncology

## 2023-10-31 ENCOUNTER — Ambulatory Visit (HOSPITAL_COMMUNITY)
Admission: RE | Admit: 2023-10-31 | Discharge: 2023-10-31 | Disposition: A | Discharge status: Home / Self Care | Source: Ambulatory Visit | Attending: Acute Care | Admitting: Acute Care

## 2023-10-31 ENCOUNTER — Encounter: Payer: Self-pay | Admitting: Radiation Oncology

## 2023-10-31 VITALS — BP 141/77 | HR 84 | Temp 97.8°F | Resp 18 | Ht 68.0 in | Wt 199.2 lb

## 2023-10-31 DIAGNOSIS — C3411 Malignant neoplasm of upper lobe, right bronchus or lung: Secondary | ICD-10-CM | POA: Insufficient documentation

## 2023-10-31 DIAGNOSIS — F17210 Nicotine dependence, cigarettes, uncomplicated: Secondary | ICD-10-CM | POA: Diagnosis not present

## 2023-10-31 DIAGNOSIS — R918 Other nonspecific abnormal finding of lung field: Secondary | ICD-10-CM | POA: Diagnosis not present

## 2023-10-31 LAB — PULMONARY FUNCTION TEST
DL/VA % pred: 53 %
DL/VA: 2.22 ml/min/mmHg/L
DLCO unc % pred: 49 %
DLCO unc: 12.65 ml/min/mmHg
FEF 25-75 Post: 2.1 L/s
FEF 25-75 Pre: 1.2 L/s
FEF2575-%Change-Post: 74 %
FEF2575-%Pred-Post: 81 %
FEF2575-%Pred-Pre: 46 %
FEV1-%Change-Post: 15 %
FEV1-%Pred-Post: 75 %
FEV1-%Pred-Pre: 65 %
FEV1-Post: 2.44 L
FEV1-Pre: 2.11 L
FEV1FVC-%Change-Post: 5 %
FEV1FVC-%Pred-Pre: 87 %
FEV6-%Change-Post: 12 %
FEV6-%Pred-Post: 85 %
FEV6-%Pred-Pre: 75 %
FEV6-Post: 3.47 L
FEV6-Pre: 3.09 L
FEV6FVC-%Change-Post: 2 %
FEV6FVC-%Pred-Post: 104 %
FEV6FVC-%Pred-Pre: 101 %
FVC-%Change-Post: 9 %
FVC-%Pred-Post: 81 %
FVC-%Pred-Pre: 74 %
FVC-Post: 3.51 L
FVC-Pre: 3.21 L
Post FEV1/FVC ratio: 70 %
Post FEV6/FVC ratio: 99 %
Pre FEV1/FVC ratio: 66 %
Pre FEV6/FVC Ratio: 96 %

## 2023-10-31 MED ORDER — ALBUTEROL SULFATE (2.5 MG/3ML) 0.083% IN NEBU
2.5000 mg | INHALATION_SOLUTION | Freq: Once | RESPIRATORY_TRACT | Status: AC
  Administered 2023-10-31: 2.5 mg via RESPIRATORY_TRACT

## 2023-11-01 ENCOUNTER — Ambulatory Visit: Admitting: Thoracic Surgery (Cardiothoracic Vascular Surgery)

## 2023-11-01 VITALS — Ht 68.0 in

## 2023-11-04 ENCOUNTER — Ambulatory Visit (HOSPITAL_COMMUNITY)

## 2023-11-07 ENCOUNTER — Ambulatory Visit (HOSPITAL_COMMUNITY)
Admission: RE | Admit: 2023-11-07 | Discharge: 2023-11-07 | Disposition: A | Discharge status: Home / Self Care | Source: Ambulatory Visit | Attending: Acute Care | Admitting: Acute Care

## 2023-11-07 DIAGNOSIS — C3490 Malignant neoplasm of unspecified part of unspecified bronchus or lung: Secondary | ICD-10-CM | POA: Diagnosis not present

## 2023-11-07 DIAGNOSIS — I6782 Cerebral ischemia: Secondary | ICD-10-CM | POA: Diagnosis not present

## 2023-11-07 DIAGNOSIS — J329 Chronic sinusitis, unspecified: Secondary | ICD-10-CM | POA: Diagnosis not present

## 2023-11-07 MED ORDER — GADOBUTROL 1 MMOL/ML IV SOLN
9.0000 mL | Freq: Once | INTRAVENOUS | Status: AC | PRN
  Administered 2023-11-07: 9 mL via INTRAVENOUS

## 2023-11-08 ENCOUNTER — Telehealth: Payer: Self-pay | Admitting: *Deleted

## 2023-11-08 NOTE — Telephone Encounter (Signed)
 Returned patient's phone call, spoke with patient

## 2023-11-09 ENCOUNTER — Other Ambulatory Visit: Payer: Self-pay | Admitting: Internal Medicine

## 2023-11-09 DIAGNOSIS — I10 Essential (primary) hypertension: Secondary | ICD-10-CM

## 2023-11-26 ENCOUNTER — Other Ambulatory Visit: Payer: Self-pay | Admitting: Internal Medicine

## 2023-11-26 DIAGNOSIS — I10 Essential (primary) hypertension: Secondary | ICD-10-CM

## 2023-12-03 NOTE — Progress Notes (Signed)
 Location of tumor and Histology per Pathology Report: Right upper lobe  Biopsy:    Past/Anticipated interventions by surgeon, if any: right    Past/Anticipated interventions by medical oncology, if any: NA    Pain issues, if any:  {:18581} {PAIN DESCRIPTION:21022940}  SAFETY ISSUES: Prior radiation? {:18581} Pacemaker/ICD? {:18581} Possible current pregnancy? no Is the patient on methotrexate? {:18581}  Current Complaints / other details:  ***     ***

## 2023-12-05 ENCOUNTER — Ambulatory Visit
Admission: RE | Admit: 2023-12-05 | Discharge: 2023-12-05 | Disposition: A | Discharge status: Home / Self Care | Source: Ambulatory Visit | Attending: Radiation Oncology | Admitting: Radiation Oncology

## 2023-12-05 ENCOUNTER — Encounter: Payer: Self-pay | Admitting: Radiation Oncology

## 2023-12-05 ENCOUNTER — Encounter: Payer: Self-pay | Admitting: *Deleted

## 2023-12-05 VITALS — BP 117/68 | HR 98 | Temp 97.3°F | Resp 18 | Ht 68.0 in | Wt 199.8 lb

## 2023-12-05 DIAGNOSIS — F17210 Nicotine dependence, cigarettes, uncomplicated: Secondary | ICD-10-CM | POA: Insufficient documentation

## 2023-12-05 DIAGNOSIS — R058 Other specified cough: Secondary | ICD-10-CM | POA: Diagnosis not present

## 2023-12-05 DIAGNOSIS — C3411 Malignant neoplasm of upper lobe, right bronchus or lung: Secondary | ICD-10-CM | POA: Insufficient documentation

## 2023-12-05 DIAGNOSIS — Z79899 Other long term (current) drug therapy: Secondary | ICD-10-CM | POA: Diagnosis not present

## 2023-12-05 DIAGNOSIS — I6782 Cerebral ischemia: Secondary | ICD-10-CM | POA: Diagnosis not present

## 2023-12-05 DIAGNOSIS — Z510 Encounter for antineoplastic radiation therapy: Secondary | ICD-10-CM | POA: Insufficient documentation

## 2023-12-05 DIAGNOSIS — R0602 Shortness of breath: Secondary | ICD-10-CM | POA: Diagnosis not present

## 2023-12-05 NOTE — Progress Notes (Deleted)
 Radiation Oncology         [phone] ________________________________  Outpatient Follow-up New  Name: Christian Howard MRN: [id number]  Date: 12/05/2023  DOB: Mar 08, 1961  [id number] Fran Imus, MD  Denson Flake, MD   REFERRING PHYSICIAN: Denson Flake, MD  DIAGNOSIS: The encounter diagnosis was Primary adenocarcinoma of upper lobe of right lung [facility]).  Stage IA2 (cT1b, N0, M0) adenocarcinoma, NSCLC, of the right upper lobe   HISTORY OF PRESENT ILLNESS: Lenorris Karger is a 63 y.o. male who was previously seen in our clinic on 10/31/2023 to discuss radiation therapy for his newly diagnosed lung cancer. At that time, Dr. Eloise Hake offered SBRT to the RUL if he was not a surgical candidate.   In the interim, the patient met with Dr. Deloise Ferries on 11/01/2023. At that time, they discussed     PREVIOUS RADIATION THERAPY:   PAST MEDICAL HISTORY:  has a past medical history of Blood transfusion without reported diagnosis (2001), Gastric ulcer with hemorrhage, GERD (gastroesophageal reflux disease), Hyperlipidemia, and Hypertension.    PAST SURGICAL HISTORY: Past Surgical History:  Procedure Laterality Date   BRONCHIAL BIOPSY  09/30/2023   Procedure: BRONCHOSCOPY, WITH BIOPSY;  Surgeon: Denson Flake, MD;  Location: [facility] ENDOSCOPY;  Service: Pulmonary;;   BRONCHIAL NEEDLE ASPIRATION BIOPSY  09/30/2023   Procedure: BRONCHOSCOPY, WITH NEEDLE ASPIRATION BIOPSY;  Surgeon: Denson Flake, MD;  Location: [facility] ENDOSCOPY;  Service: Pulmonary;;   ENDOBRONCHIAL ULTRASOUND Right 09/30/2023   Procedure: ENDOBRONCHIAL ULTRASOUND (EBUS);  Surgeon: Denson Flake, MD;  Location: [facility] ENDOSCOPY;  Service: Pulmonary;  Laterality: Right;   FINE NEEDLE ASPIRATION  09/30/2023   Procedure: FINE NEEDLE ASPIRATION;  Surgeon: Denson Flake, MD;  Location: [facility] ENDOSCOPY;  Service: Pulmonary;;   WRIST SURGERY Right 1989    FAMILY HISTORY: family history includes Cancer in his brother and brother.  SOCIAL  HISTORY:  reports that he has been smoking cigarettes. He has a 43 pack-year smoking history. He uses smokeless tobacco. He reports current alcohol use. He reports that he does not use drugs.  ALLERGIES: Patient has no known allergies.  MEDICATIONS:  Current Outpatient Medications  Medication Sig Dispense Refill   amLODipine  (NORVASC ) 5 MG tablet Take 1 tablet by mouth once daily 90 tablet 1   atorvastatin  (LIPITOR) 20 MG tablet Take 1 tablet by mouth once daily 90 tablet 1   buPROPion  (WELLBUTRIN  XL) 150 MG 24 hr tablet Take 1 tablet (150 mg total) by mouth daily. 90 tablet 1   pantoprazole  (PROTONIX ) 40 MG tablet Take 1 tablet (40 mg total) by mouth daily. 90 tablet 1   tadalafil  (CIALIS ) 20 MG tablet Take 1 tablet (20 mg total) by mouth daily as needed for erectile dysfunction. 10 tablet 2   valsartan -hydrochlorothiazide  (DIOVAN -HCT) 160-25 MG tablet Take 1 tablet by mouth once daily 90 tablet 1   No current facility-administered medications for this encounter.    REVIEW OF SYSTEMS:  A 15 point review of systems is documented in the electronic medical record. This was obtained by the nursing staff. However, I reviewed this with the patient to discuss relevant findings and make appropriate changes.     PHYSICAL EXAM:  vitals were not taken for this visit.       0 - Asymptomatic (Fully active, able to carry on all predisease activities without restriction)  1 - Symptomatic but completely ambulatory (Restricted in physically strenuous activity but ambulatory and able to carry out work of a light or sedentary  nature. For example, light housework, office work)  2 - Symptomatic, <50% in bed during the day (Ambulatory and capable of all self care but unable to carry out any work activities. Up and about more than 50% of waking hours)  3 - Symptomatic, >50% in bed, but not bedbound (Capable of only limited self-care, confined to bed or chair 50% or more of waking hours)  4 - Bedbound  (Completely disabled. Cannot carry on any self-care. Totally confined to bed or chair)  5 - Death   Aurea Blossom MM, Creech RH, Tormey DC, et al. [phone]). Toxicity and response criteria of the [facility]. Am. Hillard Lowes. Oncol. 5 (6): 649-55  LABORATORY DATA:  Lab Results  Component Value Date   WBC 7.5 04/11/2023   HGB 16.5 04/11/2023   HCT 50.5 04/11/2023   MCV 92.6 04/11/2023   PLT 270.0 04/11/2023   NEUTROABS 4.3 04/11/2023   Lab Results  Component Value Date   NA 134 (L) 09/30/2023   K 4.2 09/30/2023   CL 104 09/30/2023   CO2 23 09/30/2023   GLUCOSE 106 (H) 09/30/2023   BUN 20 09/30/2023   CREATININE 1.06 09/30/2023   CALCIUM  9.1 09/30/2023      RADIOGRAPHY: MR BRAIN W WO CONTRAST Result Date: 11/07/2023 CLINICAL DATA:  Provided history: Non-small cell lung cancer, staging. Malignant neoplasm of unspecified part of unspecified bronchus or lung. EXAM: MRI HEAD WITHOUT AND WITH CONTRAST TECHNIQUE: Multiplanar, multiecho pulse sequences of the brain and surrounding structures were obtained without and with intravenous contrast. CONTRAST:  9mL GADAVIST  GADOBUTROL  1 MMOL/ML IV SOLN COMPARISON:  None. FINDINGS: Brain: No age-advanced or lobar predominant cerebral atrophy. Multifocal T2 FLAIR hyperintense signal abnormality within the cerebral white matter, nonspecific but compatible with mild chronic small vessel ischemic disease. Moderate chronic small vessel ischemic changes within the pons. There is no acute infarct. No evidence of an intracranial mass. No chronic intracranial blood products. No extra-axial fluid collection. No midline shift. No pathologic intracranial enhancement identified. Vascular: Maintained flow voids within the proximal large arterial vessels. Skull and upper cervical spine: No focal worrisome marrow lesion. Sinuses/Orbits: No mass or acute finding within the imaged orbits. Mild mucosal thickening within the right maxillary sinus. Severe mucosal  thickening within the left maxillary sinus. Mild mucosal thickening within the left ethmoid and left frontal sinuses. Other: Trace fluid within bilateral mastoid air cells. IMPRESSION: 1. No evidence of intracranial metastatic disease. 2. Chronic small vessel ischemic changes which are mild in the cerebral white matter, and moderate in the pons. 3. Paranasal sinus disease as described. Electronically Signed   By: Bascom Lily D.O.   On: 11/07/2023 18:17      IMPRESSION: Stage IA2 (cT1b, N0, M0) adenocarcinoma, NSCLC, of the right upper lobe   Patient would like to proceed with radiation treatment.   PLAN: Patient is scheduled for CT simulation later today. Anticipate a course of SBRT (3-5 fractions) versus UHRT (10 fractions) pending treatment planning.    We spent minutes face to face with the patient and more than 50% of that time was spent in counseling and/or coordination of care.   ------------------------------------------------   Julio Ohm, PA-C   Noralee Beam, PhD, MD   [facility]  Radiation Oncology Direct Dial: [phone]  Fax: [phone] [email or web address]

## 2023-12-05 NOTE — Progress Notes (Signed)
 Radiation Oncology         [phone] ________________________________  Outpatient Follow-up New  Name: Christian Howard MRN: [id number]  Date: 12/05/2023  DOB: April 08, 1961  BM:[id number] Christian Imus, MD  Christian Flake, MD   REFERRING PHYSICIAN: Denson Flake, MD  DIAGNOSIS: The encounter diagnosis was Primary adenocarcinoma of upper lobe of right lung [facility]).  Stage IA2 (cT1b, N0, M0) adenocarcinoma, NSCLC, of the right upper lobe   HISTORY OF PRESENT ILLNESS: Christian Howard is a 63 y.o. male who was previously seen in our clinic on 10/31/2023 to discuss radiation therapy for his newly diagnosed lung cancer. At that time, Dr. Eloise Howard offered radiation to the RUL if he was not a surgical candidate.   In the interim, the patient decided not to meet with Dr. Deloise Howard. He states that he is not interested in a surgery discussion and would like to move forward with radiation treatment at this time.  He notes continued shortness of breath and a productive cough. He denies any hemoptysis or chest pain. He continues to smoke 6-7 cigarettes/day and is interested in quitting. He is using nicotine patches and Wellbutrin .    PREVIOUS RADIATION THERAPY: No  PAST MEDICAL HISTORY:  has a past medical history of Blood transfusion without reported diagnosis (2001), Gastric ulcer with hemorrhage, GERD (gastroesophageal reflux disease), Hyperlipidemia, and Hypertension.    PAST SURGICAL HISTORY: Past Surgical History:  Procedure Laterality Date   BRONCHIAL BIOPSY  09/30/2023   Procedure: BRONCHOSCOPY, WITH BIOPSY;  Surgeon: Christian Flake, MD;  Location: [facility] ENDOSCOPY;  Service: Pulmonary;;   BRONCHIAL NEEDLE ASPIRATION BIOPSY  09/30/2023   Procedure: BRONCHOSCOPY, WITH NEEDLE ASPIRATION BIOPSY;  Surgeon: Christian Flake, MD;  Location: [facility] ENDOSCOPY;  Service: Pulmonary;;   ENDOBRONCHIAL ULTRASOUND Right 09/30/2023   Procedure: ENDOBRONCHIAL ULTRASOUND (EBUS);  Surgeon: Christian Flake, MD;   Location: [facility] ENDOSCOPY;  Service: Pulmonary;  Laterality: Right;   FINE NEEDLE ASPIRATION  09/30/2023   Procedure: FINE NEEDLE ASPIRATION;  Surgeon: Christian Flake, MD;  Location: [facility] ENDOSCOPY;  Service: Pulmonary;;   WRIST SURGERY Right 1989    FAMILY HISTORY: family history includes Cancer in his brother, brother, and sister.  SOCIAL HISTORY:  reports that he has been smoking cigarettes. He has a 43 pack-year smoking history. He uses smokeless tobacco. He reports current alcohol use. He reports that he does not use drugs.  ALLERGIES: Patient has no known allergies.  MEDICATIONS:  Current Outpatient Medications  Medication Sig Dispense Refill   amLODipine  (NORVASC ) 5 MG tablet Take 1 tablet by mouth once daily 90 tablet 1   atorvastatin  (LIPITOR) 20 MG tablet Take 1 tablet by mouth once daily 90 tablet 1   buPROPion  (WELLBUTRIN  XL) 150 MG 24 hr tablet Take 1 tablet (150 mg total) by mouth daily. 90 tablet 1   pantoprazole  (PROTONIX ) 40 MG tablet Take 1 tablet (40 mg total) by mouth daily. 90 tablet 1   tadalafil  (CIALIS ) 20 MG tablet Take 1 tablet (20 mg total) by mouth daily as needed for erectile dysfunction. 10 tablet 2   valsartan -hydrochlorothiazide  (DIOVAN -HCT) 160-25 MG tablet Take 1 tablet by mouth once daily 90 tablet 1   No current facility-administered medications for this encounter.    REVIEW OF SYSTEMS: Notable for that above.    PHYSICAL EXAM:  height is 5' 8 (1.727 m) and weight is 199 lb 12.8 oz (90.6 kg). His temperature is 97.3 F (36.3 C) (abnormal). His blood pressure is 117/68 and his  pulse is 98. His respiration is 18 and oxygen saturation is 97%.   In general this is a well appearing male in no acute distress. He's alert and oriented x4 and appropriate throughout the examination. Cardiopulmonary assessment is negative for acute distress and he exhibits normal effort.       ECOG = 0  0 - Asymptomatic (Fully active, able to carry on all predisease activities  without restriction)  1 - Symptomatic but completely ambulatory (Restricted in physically strenuous activity but ambulatory and able to carry out work of a light or sedentary nature. For example, light housework, office work)  2 - Symptomatic, <50% in bed during the day (Ambulatory and capable of all self care but unable to carry out any work activities. Up and about more than 50% of waking hours)  3 - Symptomatic, >50% in bed, but not bedbound (Capable of only limited self-care, confined to bed or chair 50% or more of waking hours)  4 - Bedbound (Completely disabled. Cannot carry on any self-care. Totally confined to bed or chair)  5 - Death   Aurea Howard MM, Christian Howard, Christian Howard, et al. [phone]). Toxicity and response criteria of the [facility]. Am. Hillard Lowes. Oncol. 5 (6): 649-55  LABORATORY DATA:  Lab Results  Component Value Date   WBC 7.5 04/11/2023   HGB 16.5 04/11/2023   HCT 50.5 04/11/2023   MCV 92.6 04/11/2023   PLT 270.0 04/11/2023   NEUTROABS 4.3 04/11/2023   Lab Results  Component Value Date   NA 134 (L) 09/30/2023   K 4.2 09/30/2023   CL 104 09/30/2023   CO2 23 09/30/2023   GLUCOSE 106 (H) 09/30/2023   BUN 20 09/30/2023   CREATININE 1.06 09/30/2023   CALCIUM  9.1 09/30/2023      RADIOGRAPHY: MR BRAIN W WO CONTRAST Result Date: 11/07/2023 CLINICAL DATA:  Provided history: Non-small cell lung cancer, staging. Malignant neoplasm of unspecified part of unspecified bronchus or lung. EXAM: MRI HEAD WITHOUT AND WITH CONTRAST TECHNIQUE: Multiplanar, multiecho pulse sequences of the brain and surrounding structures were obtained without and with intravenous contrast. CONTRAST:  9mL GADAVIST  GADOBUTROL  1 MMOL/ML IV SOLN COMPARISON:  None. FINDINGS: Brain: No age-advanced or lobar predominant cerebral atrophy. Multifocal T2 FLAIR hyperintense signal abnormality within the cerebral white matter, nonspecific but compatible with mild chronic small vessel ischemic  disease. Moderate chronic small vessel ischemic changes within the pons. There is no acute infarct. No evidence of an intracranial mass. No chronic intracranial blood products. No extra-axial fluid collection. No midline shift. No pathologic intracranial enhancement identified. Vascular: Maintained flow voids within the proximal large arterial vessels. Skull and upper cervical spine: No focal worrisome marrow lesion. Sinuses/Orbits: No mass or acute finding within the imaged orbits. Mild mucosal thickening within the right maxillary sinus. Severe mucosal thickening within the left maxillary sinus. Mild mucosal thickening within the left ethmoid and left frontal sinuses. Other: Trace fluid within bilateral mastoid air cells. IMPRESSION: 1. No evidence of intracranial metastatic disease. 2. Chronic small vessel ischemic changes which are mild in the cerebral white matter, and moderate in the pons. 3. Paranasal sinus disease as described. Electronically Signed   By: Bascom Lily D.O.   On: 11/07/2023 18:17      IMPRESSION/PLAN: Stage IA2 (cT1b, N0, M0) adenocarcinoma, NSCLC, of the right upper lobe   Patient has decided he would like to proceed with radiation treatment. We again talked to the patient about the findings and work-up.  We discussed  the natural history of early stage lung cancer and general treatment, highlighting the role of radiotherapy in the management.  We discussed the available radiation techniques, and focused on the details of logistics and delivery.  We reviewed the anticipated acute and late sequelae associated with radiation in this setting.  The patient was encouraged to ask questions that I answered to the best of my ability. A patient consent form was discussed and signed.  We retained a copy for our records.   He is scheduled for CT simulation later today. Anticipate a course of SBRT (3-5 fractions) versus UHRT (10 fractions), pending treatment planning. We look forward to  participating in this patient's care.    We spent 30 minutes face to face with the patient and more than 50% of that time was spent in counseling and/or coordination of care.   ------------------------------------------------   Julio Ohm, PA-C   Noralee Beam, PhD, MD   [facility]  Radiation Oncology Direct Dial: [phone]  Fax: [phone] .com

## 2023-12-06 ENCOUNTER — Inpatient Hospital Stay: Attending: Radiation Oncology | Admitting: Licensed Clinical Social Worker

## 2023-12-06 DIAGNOSIS — C3411 Malignant neoplasm of upper lobe, right bronchus or lung: Secondary | ICD-10-CM

## 2023-12-06 NOTE — Progress Notes (Signed)
 [facility] Clinical Social Work  Initial Assessment   Christian Howard is a 63 y.o. year old male contacted by phone. Clinical Social Work was referred by medical provider for assessment of psychosocial needs.   SDOH (Social Determinants of Health) assessments performed: Yes   SDOH Screenings   Food Insecurity: Food Insecurity Present (12/05/2023)  Housing: High Risk (12/05/2023)  Transportation Needs: No Transportation Needs (12/05/2023)  Utilities: At Risk (12/05/2023)  Depression (PHQ2-9): Low Risk  (12/05/2023)  Tobacco Use: High Risk (10/18/2023)     Distress Screen completed: No     No data to display            Family/Social Information:  Housing Arrangement: patient lives with his wife Family members/support persons in your life? Support is limited for pt.  Pt reports not having any family in the immediate area.  Primary support will come from pt's wife.   Transportation concerns: no  Employment: Working full time pt works for [employer].  Income source: Employment Financial concerns: Yes, current concerns Type of concern: Utilities and Rent/ mortgage Food access concerns: yes Religious or spiritual practice: Yes-Christian Advanced directives: Not known Services Currently in place:  none  Coping/ Adjustment to diagnosis: Patient understands treatment plan and what happens next? yes Concerns about diagnosis and/or treatment: Losing my job and/or losing income and Overwhelmed by information Patient reported stressors: Therapist, art and/or priorities: pt's priority is to start treatment w/ the hope of positive results Patient enjoys not addressed Current coping skills/ strengths: Capable of independent living , Motivation for treatment/growth , and Physical Health     SUMMARY: Current SDOH Barriers:  Financial constraints related to limited income  Clinical Social Work Clinical Goal(s):  Scientist, research (life sciences) options for unmet needs related to:  Financial Strain    Interventions: Discussed common feeling and emotions when being diagnosed with cancer, and the importance of support during treatment Informed patient of the support team roles and support services at [facility] Provided CSW contact information and encouraged patient to call with any questions or concerns Provided pt w/ information about the [employer] and encouraged pt to apply for food stamps for eligibility.  CSW also informed pt of the [facility] food pantry should pt feel he may benefit from this resource while undergoing treatment.      Follow Up Plan: Patient will contact CSW with any support or resource needs Patient verbalizes understanding of plan: Yes    Christian Bruns, LCSW Clinical Social Worker [facility]  Patient is participating in a Managed Medicaid Plan:  Yes

## 2023-12-11 ENCOUNTER — Inpatient Hospital Stay: Admitting: Licensed Clinical Social Worker

## 2023-12-11 ENCOUNTER — Encounter: Payer: Self-pay | Admitting: *Deleted

## 2023-12-11 DIAGNOSIS — C3411 Malignant neoplasm of upper lobe, right bronchus or lung: Secondary | ICD-10-CM

## 2023-12-11 NOTE — Progress Notes (Signed)
 [facility] CSW Progress Note  Clinical Child psychotherapist contacted patient by phone to follow-up on request for call back.    Interventions: CSW received a request from pt for a letter on [facility] letterhead verifying diagnosis and treatment at the cancer center to present to DSS with his application for food stamps.  Letter drafted and left at the front reception desk for pt to pick up.         Devere JONELLE Manna, LCSW Clinical Social Worker [facility]    Patient is participating in a Managed Medicaid Plan:  Yes

## 2023-12-19 ENCOUNTER — Inpatient Hospital Stay: Attending: Radiation Oncology | Admitting: Licensed Clinical Social Worker

## 2023-12-19 DIAGNOSIS — C3411 Malignant neoplasm of upper lobe, right bronchus or lung: Secondary | ICD-10-CM

## 2023-12-19 NOTE — Progress Notes (Signed)
 [facility] CSW Progress Note  Clinical Child psychotherapist contacted patient by phone to follow-up on financial concerns.    Interventions: CSW informed by pt that he does not qualify for food stamps.  CSW sending a referral to Cancer Services for any support they may be able to provide.      Follow Up Plan:  Patient will contact CSW with any support or resource needs    Devere JONELLE Manna, LCSW Clinical Social Worker [facility]

## 2023-12-23 DIAGNOSIS — Z510 Encounter for antineoplastic radiation therapy: Secondary | ICD-10-CM | POA: Diagnosis not present

## 2023-12-23 DIAGNOSIS — C3411 Malignant neoplasm of upper lobe, right bronchus or lung: Secondary | ICD-10-CM | POA: Diagnosis not present

## 2023-12-23 DIAGNOSIS — F17210 Nicotine dependence, cigarettes, uncomplicated: Secondary | ICD-10-CM | POA: Diagnosis not present

## 2023-12-25 ENCOUNTER — Telehealth: Payer: Self-pay | Admitting: *Deleted

## 2023-12-25 NOTE — Telephone Encounter (Signed)
 Kees Idrovo, [phone] (home) calling about forms faxed form my FMLA.   Treatment begins Monday.  Did not know it takes 7-days.  Could someone call me back. Need to pick up completed form tomorrow or Friday.  I will sign the HIPAA authorization then.  I need to get this to HR before Monday.  During call discussed forms process and timeframe.  Form logged is as received 12/23/2023.  Advised this nurse will notify form staff member processing his paperwork.  No further questions or needs.

## 2023-12-25 NOTE — Telephone Encounter (Signed)
 Cancel.  Duplicate opening of EMR.

## 2023-12-26 ENCOUNTER — Telehealth: Payer: Self-pay

## 2023-12-26 NOTE — Telephone Encounter (Signed)
 Pt came in to pick up his work accomodation form and to sign an ROI. No questions or concerns to be noted at this time.

## 2023-12-30 ENCOUNTER — Other Ambulatory Visit: Payer: Self-pay

## 2023-12-30 ENCOUNTER — Ambulatory Visit
Admission: RE | Admit: 2023-12-30 | Discharge: 2023-12-30 | Disposition: A | Discharge status: Home / Self Care | Source: Ambulatory Visit | Attending: Radiation Oncology | Admitting: Radiation Oncology

## 2023-12-30 ENCOUNTER — Ambulatory Visit

## 2023-12-30 ENCOUNTER — Encounter: Payer: Self-pay | Admitting: *Deleted

## 2023-12-30 ENCOUNTER — Ambulatory Visit: Admitting: Radiation Oncology

## 2023-12-30 DIAGNOSIS — C3411 Malignant neoplasm of upper lobe, right bronchus or lung: Secondary | ICD-10-CM

## 2023-12-30 DIAGNOSIS — F17210 Nicotine dependence, cigarettes, uncomplicated: Secondary | ICD-10-CM | POA: Diagnosis not present

## 2023-12-30 DIAGNOSIS — Z510 Encounter for antineoplastic radiation therapy: Secondary | ICD-10-CM | POA: Diagnosis not present

## 2023-12-30 LAB — RAD ONC ARIA SESSION SUMMARY
Course Elapsed Days: 0
Plan Fractions Treated to Date: 1
Plan Prescribed Dose Per Fraction: 5 Gy
Plan Total Fractions Prescribed: 10
Plan Total Prescribed Dose: 50 Gy
Reference Point Dosage Given to Date: 5 Gy
Reference Point Session Dosage Given: 5 Gy
Session Number: 1

## 2023-12-31 ENCOUNTER — Ambulatory Visit
Admission: RE | Admit: 2023-12-31 | Discharge: 2023-12-31 | Disposition: A | Discharge status: Home / Self Care | Source: Ambulatory Visit | Attending: Radiation Oncology | Admitting: Radiation Oncology

## 2023-12-31 ENCOUNTER — Other Ambulatory Visit: Payer: Self-pay

## 2023-12-31 DIAGNOSIS — Z510 Encounter for antineoplastic radiation therapy: Secondary | ICD-10-CM | POA: Diagnosis not present

## 2023-12-31 DIAGNOSIS — C3411 Malignant neoplasm of upper lobe, right bronchus or lung: Secondary | ICD-10-CM | POA: Diagnosis not present

## 2023-12-31 DIAGNOSIS — F17210 Nicotine dependence, cigarettes, uncomplicated: Secondary | ICD-10-CM | POA: Diagnosis not present

## 2023-12-31 LAB — RAD ONC ARIA SESSION SUMMARY
Course Elapsed Days: 1
Plan Fractions Treated to Date: 2
Plan Prescribed Dose Per Fraction: 5 Gy
Plan Total Fractions Prescribed: 10
Plan Total Prescribed Dose: 50 Gy
Reference Point Dosage Given to Date: 10 Gy
Reference Point Session Dosage Given: 5 Gy
Session Number: 2

## 2024-01-01 ENCOUNTER — Other Ambulatory Visit: Payer: Self-pay

## 2024-01-01 ENCOUNTER — Ambulatory Visit
Admission: RE | Admit: 2024-01-01 | Discharge: 2024-01-01 | Disposition: A | Discharge status: Home / Self Care | Source: Ambulatory Visit | Attending: Radiation Oncology | Admitting: Radiation Oncology

## 2024-01-01 DIAGNOSIS — Z510 Encounter for antineoplastic radiation therapy: Secondary | ICD-10-CM | POA: Diagnosis not present

## 2024-01-01 DIAGNOSIS — F17210 Nicotine dependence, cigarettes, uncomplicated: Secondary | ICD-10-CM | POA: Diagnosis not present

## 2024-01-01 DIAGNOSIS — C3411 Malignant neoplasm of upper lobe, right bronchus or lung: Secondary | ICD-10-CM | POA: Diagnosis not present

## 2024-01-01 LAB — RAD ONC ARIA SESSION SUMMARY
Course Elapsed Days: 2
Plan Fractions Treated to Date: 3
Plan Prescribed Dose Per Fraction: 5 Gy
Plan Total Fractions Prescribed: 10
Plan Total Prescribed Dose: 50 Gy
Reference Point Dosage Given to Date: 15 Gy
Reference Point Session Dosage Given: 5 Gy
Session Number: 3

## 2024-01-02 ENCOUNTER — Ambulatory Visit
Admission: RE | Admit: 2024-01-02 | Discharge: 2024-01-02 | Disposition: A | Discharge status: Home / Self Care | Source: Ambulatory Visit | Attending: Radiation Oncology | Admitting: Radiation Oncology

## 2024-01-02 ENCOUNTER — Other Ambulatory Visit: Payer: Self-pay

## 2024-01-02 DIAGNOSIS — Z510 Encounter for antineoplastic radiation therapy: Secondary | ICD-10-CM | POA: Diagnosis not present

## 2024-01-02 DIAGNOSIS — C3411 Malignant neoplasm of upper lobe, right bronchus or lung: Secondary | ICD-10-CM | POA: Diagnosis not present

## 2024-01-02 DIAGNOSIS — F17210 Nicotine dependence, cigarettes, uncomplicated: Secondary | ICD-10-CM | POA: Diagnosis not present

## 2024-01-02 LAB — RAD ONC ARIA SESSION SUMMARY
Course Elapsed Days: 3
Plan Fractions Treated to Date: 4
Plan Prescribed Dose Per Fraction: 5 Gy
Plan Total Fractions Prescribed: 10
Plan Total Prescribed Dose: 50 Gy
Reference Point Dosage Given to Date: 20 Gy
Reference Point Session Dosage Given: 5 Gy
Session Number: 4

## 2024-01-03 ENCOUNTER — Ambulatory Visit
Admission: RE | Admit: 2024-01-03 | Discharge: 2024-01-03 | Disposition: A | Discharge status: Home / Self Care | Source: Ambulatory Visit | Attending: Radiation Oncology | Admitting: Radiation Oncology

## 2024-01-03 ENCOUNTER — Encounter: Payer: Self-pay | Admitting: *Deleted

## 2024-01-03 ENCOUNTER — Other Ambulatory Visit: Payer: Self-pay

## 2024-01-03 DIAGNOSIS — C3411 Malignant neoplasm of upper lobe, right bronchus or lung: Secondary | ICD-10-CM | POA: Diagnosis not present

## 2024-01-03 DIAGNOSIS — F17210 Nicotine dependence, cigarettes, uncomplicated: Secondary | ICD-10-CM | POA: Diagnosis not present

## 2024-01-03 DIAGNOSIS — Z510 Encounter for antineoplastic radiation therapy: Secondary | ICD-10-CM | POA: Diagnosis not present

## 2024-01-03 LAB — RAD ONC ARIA SESSION SUMMARY
Course Elapsed Days: 4
Plan Fractions Treated to Date: 5
Plan Prescribed Dose Per Fraction: 5 Gy
Plan Total Fractions Prescribed: 10
Plan Total Prescribed Dose: 50 Gy
Reference Point Dosage Given to Date: 25 Gy
Reference Point Session Dosage Given: 5 Gy
Session Number: 5

## 2024-01-06 ENCOUNTER — Ambulatory Visit
Admission: RE | Admit: 2024-01-06 | Discharge: 2024-01-06 | Disposition: A | Discharge status: Home / Self Care | Source: Ambulatory Visit | Attending: Radiation Oncology | Admitting: Radiation Oncology

## 2024-01-06 ENCOUNTER — Other Ambulatory Visit: Payer: Self-pay

## 2024-01-06 DIAGNOSIS — F17210 Nicotine dependence, cigarettes, uncomplicated: Secondary | ICD-10-CM | POA: Diagnosis not present

## 2024-01-06 DIAGNOSIS — C3411 Malignant neoplasm of upper lobe, right bronchus or lung: Secondary | ICD-10-CM | POA: Diagnosis not present

## 2024-01-06 DIAGNOSIS — Z510 Encounter for antineoplastic radiation therapy: Secondary | ICD-10-CM | POA: Diagnosis not present

## 2024-01-06 LAB — RAD ONC ARIA SESSION SUMMARY
Course Elapsed Days: 7
Plan Fractions Treated to Date: 6
Plan Prescribed Dose Per Fraction: 5 Gy
Plan Total Fractions Prescribed: 10
Plan Total Prescribed Dose: 50 Gy
Reference Point Dosage Given to Date: 30 Gy
Reference Point Session Dosage Given: 5 Gy
Session Number: 6

## 2024-01-07 ENCOUNTER — Ambulatory Visit

## 2024-01-07 ENCOUNTER — Ambulatory Visit
Admission: RE | Admit: 2024-01-07 | Discharge: 2024-01-07 | Discharge status: Home / Self Care | Source: Ambulatory Visit | Attending: Radiation Oncology | Admitting: Radiation Oncology

## 2024-01-07 ENCOUNTER — Other Ambulatory Visit: Payer: Self-pay

## 2024-01-07 DIAGNOSIS — F17210 Nicotine dependence, cigarettes, uncomplicated: Secondary | ICD-10-CM | POA: Diagnosis not present

## 2024-01-07 DIAGNOSIS — C3411 Malignant neoplasm of upper lobe, right bronchus or lung: Secondary | ICD-10-CM | POA: Diagnosis not present

## 2024-01-07 DIAGNOSIS — Z510 Encounter for antineoplastic radiation therapy: Secondary | ICD-10-CM | POA: Diagnosis not present

## 2024-01-07 LAB — RAD ONC ARIA SESSION SUMMARY
Course Elapsed Days: 8
Plan Fractions Treated to Date: 7
Plan Prescribed Dose Per Fraction: 5 Gy
Plan Total Fractions Prescribed: 10
Plan Total Prescribed Dose: 50 Gy
Reference Point Dosage Given to Date: 35 Gy
Reference Point Session Dosage Given: 5 Gy
Session Number: 7

## 2024-01-08 ENCOUNTER — Ambulatory Visit
Admission: RE | Admit: 2024-01-08 | Discharge: 2024-01-08 | Disposition: A | Discharge status: Home / Self Care | Source: Ambulatory Visit | Attending: Radiation Oncology | Admitting: Radiation Oncology

## 2024-01-08 ENCOUNTER — Other Ambulatory Visit: Payer: Self-pay

## 2024-01-08 DIAGNOSIS — F17210 Nicotine dependence, cigarettes, uncomplicated: Secondary | ICD-10-CM | POA: Diagnosis not present

## 2024-01-08 DIAGNOSIS — C3411 Malignant neoplasm of upper lobe, right bronchus or lung: Secondary | ICD-10-CM | POA: Diagnosis not present

## 2024-01-08 DIAGNOSIS — Z510 Encounter for antineoplastic radiation therapy: Secondary | ICD-10-CM | POA: Diagnosis not present

## 2024-01-08 LAB — RAD ONC ARIA SESSION SUMMARY
Course Elapsed Days: 9
Plan Fractions Treated to Date: 8
Plan Prescribed Dose Per Fraction: 5 Gy
Plan Total Fractions Prescribed: 10
Plan Total Prescribed Dose: 50 Gy
Reference Point Dosage Given to Date: 40 Gy
Reference Point Session Dosage Given: 5 Gy
Session Number: 8

## 2024-01-09 ENCOUNTER — Ambulatory Visit

## 2024-01-09 ENCOUNTER — Other Ambulatory Visit: Payer: Self-pay

## 2024-01-09 ENCOUNTER — Ambulatory Visit
Admission: RE | Admit: 2024-01-09 | Discharge: 2024-01-09 | Disposition: A | Discharge status: Home / Self Care | Source: Ambulatory Visit | Attending: Radiation Oncology | Admitting: Radiation Oncology

## 2024-01-09 DIAGNOSIS — C3411 Malignant neoplasm of upper lobe, right bronchus or lung: Secondary | ICD-10-CM | POA: Diagnosis not present

## 2024-01-09 DIAGNOSIS — Z510 Encounter for antineoplastic radiation therapy: Secondary | ICD-10-CM | POA: Diagnosis not present

## 2024-01-09 DIAGNOSIS — F17210 Nicotine dependence, cigarettes, uncomplicated: Secondary | ICD-10-CM | POA: Diagnosis not present

## 2024-01-09 LAB — RAD ONC ARIA SESSION SUMMARY
Course Elapsed Days: 10
Plan Fractions Treated to Date: 9
Plan Prescribed Dose Per Fraction: 5 Gy
Plan Total Fractions Prescribed: 10
Plan Total Prescribed Dose: 50 Gy
Reference Point Dosage Given to Date: 45 Gy
Reference Point Session Dosage Given: 5 Gy
Session Number: 9

## 2024-01-10 ENCOUNTER — Ambulatory Visit

## 2024-01-11 ENCOUNTER — Other Ambulatory Visit: Payer: Self-pay | Admitting: Internal Medicine

## 2024-01-11 DIAGNOSIS — N529 Male erectile dysfunction, unspecified: Secondary | ICD-10-CM

## 2024-01-13 ENCOUNTER — Ambulatory Visit: Admitting: Radiation Oncology

## 2024-01-13 ENCOUNTER — Ambulatory Visit
Admission: RE | Admit: 2024-01-13 | Discharge: 2024-01-13 | Disposition: A | Discharge status: Home / Self Care | Source: Ambulatory Visit | Attending: Radiation Oncology | Admitting: Radiation Oncology

## 2024-01-13 ENCOUNTER — Other Ambulatory Visit: Payer: Self-pay

## 2024-01-13 DIAGNOSIS — Z510 Encounter for antineoplastic radiation therapy: Secondary | ICD-10-CM | POA: Diagnosis not present

## 2024-01-13 DIAGNOSIS — C3411 Malignant neoplasm of upper lobe, right bronchus or lung: Secondary | ICD-10-CM | POA: Diagnosis not present

## 2024-01-13 DIAGNOSIS — F17210 Nicotine dependence, cigarettes, uncomplicated: Secondary | ICD-10-CM | POA: Diagnosis not present

## 2024-01-13 LAB — RAD ONC ARIA SESSION SUMMARY
Course Elapsed Days: 14
Plan Fractions Treated to Date: 10
Plan Prescribed Dose Per Fraction: 5 Gy
Plan Total Fractions Prescribed: 10
Plan Total Prescribed Dose: 50 Gy
Reference Point Dosage Given to Date: 50 Gy
Reference Point Session Dosage Given: 5 Gy
Session Number: 10

## 2024-01-14 NOTE — Radiation Completion Notes (Signed)
  Radiation Oncology         [phone]) [phone] ________________________________  Name: Christian Howard MRN: [id number]  Date of Service: 01/13/2024  DOB: 18-Dec-1960  End of Treatment Note  Diagnosis: Stage IA2 (cT1b, N0, M0) adenocarcinoma, NSCLC, of the right upper lobe  Intent: Curative     ==========DELIVERED PLANS==========  First Treatment Date: 2023-12-30 Last Treatment Date: 2024-01-13   Plan Name: Lung_Rt_UHRT Site: Lung, Right Technique: IMRT Mode: Photon Dose Per Fraction: 5 Gy Prescribed Dose (Delivered / Prescribed): 50 Gy / 50 Gy Prescribed Fxs (Delivered / Prescribed): 10 / 10     ====================================   The patient tolerated radiation well and developed fatigue throughout his treatment. He continued to experience shortness of breath with a productive cough throughout his treatment.    The patient will return in one month.      Ronita Due, PA-C

## 2024-02-03 ENCOUNTER — Ambulatory Visit: Admitting: Radiation Oncology

## 2024-02-05 ENCOUNTER — Encounter: Payer: Self-pay | Admitting: Radiation Oncology

## 2024-02-05 NOTE — Progress Notes (Signed)
  Radiation Oncology         [phone]) [phone] ________________________________  Name: Christian Howard MRN: [id number]  Date: 02/06/2024  DOB: 1961-01-16  Follow-Up Visit Note  CC: Theophilus Andrews, Tully GRADE, MD  Theophilus Andrews, Estel*  No diagnosis found.  Diagnosis: The encounter diagnosis was Primary adenocarcinoma of upper lobe of right lung (HCC).   Stage IA2 (cT1b, N0, M0) adenocarcinoma, NSCLC, of the right upper lobe   Interval Since Last Radiation:  25 days  Intent: curative  First Treatment Date: 2023-12-30 Last Treatment Date: 2024-01-13   Plan Name: Lung_Rt_UHRT Site: Lung, Right Technique: IMRT Mode: Photon Dose Per Fraction: 5 Gy Prescribed Dose (Delivered / Prescribed): 50 Gy / 50 Gy Prescribed Fxs (Delivered / Prescribed): 10 / 10   Narrative:  The patient returns today for routine follow-up. He was last seen in office on 12/05/23 for a follow up visit. Since then, patient completed his radiation treatment which he  tolerated quite well. Patient did however endorse experiencing fatigue throughout his treatment along with shortness of breath with a productive cough.                               No other significant oncologic interval history since the patient was last seen.   Allergies:  has no known allergies.  Meds: Current Outpatient Medications  Medication Sig Dispense Refill   amLODipine  (NORVASC ) 5 MG tablet Take 1 tablet by mouth once daily 90 tablet 1   atorvastatin  (LIPITOR) 20 MG tablet Take 1 tablet by mouth once daily 90 tablet 1   buPROPion  (WELLBUTRIN  XL) 150 MG 24 hr tablet Take 1 tablet (150 mg total) by mouth daily. 90 tablet 1   pantoprazole  (PROTONIX ) 40 MG tablet Take 1 tablet (40 mg total) by mouth daily. 90 tablet 1   tadalafil  (CIALIS ) 20 MG tablet TAKE 1 TABLET BY MOUTH ONCE DAILY AS NEEDED FOR ERECTILE DYSFUNCTION 10 tablet 0   valsartan -hydrochlorothiazide  (DIOVAN -HCT) 160-25 MG tablet Take 1 tablet by mouth once daily 90 tablet 1   No  current facility-administered medications for this encounter.    Physical Findings: The patient is in no acute distress. Patient is alert and oriented.  vitals were not taken for this visit. .  No significant changes. Lungs are clear to auscultation bilaterally. Heart has regular rate and rhythm. No palpable cervical, supraclavicular, or axillary adenopathy. Abdomen soft, non-tender, normal bowel sounds.   Lab Findings: Lab Results  Component Value Date   WBC 7.5 04/11/2023   HGB 16.5 04/11/2023   HCT 50.5 04/11/2023   MCV 92.6 04/11/2023   PLT 270.0 04/11/2023    Radiographic Findings: No results found.  Impression:  Stage IA2 (cT1b, N0, M0) adenocarcinoma, NSCLC, of the right upper lobe  The patient is recovering from the effects of radiation.  ***  Plan:  ***   *** minutes of total time was spent for this patient encounter, including preparation, face-to-face counseling with the patient and coordination of care, physical exam, and documentation of the encounter. ____________________________________  Lynwood CHARM Nasuti, PhD, MD  This document serves as a record of services personally performed by Lynwood Nasuti, MD. It was created on his behalf by Reymundo Cartwright, a trained medical scribe. The creation of this record is based on the scribe's personal observations and the provider's statements to them. This document has been checked and approved by the attending provider.

## 2024-02-06 ENCOUNTER — Encounter: Payer: Self-pay | Admitting: Radiation Oncology

## 2024-02-06 ENCOUNTER — Ambulatory Visit
Admission: RE | Admit: 2024-02-06 | Discharge: 2024-02-06 | Disposition: A | Discharge status: Home / Self Care | Source: Ambulatory Visit | Attending: Radiation Oncology | Admitting: Radiation Oncology

## 2024-02-06 DIAGNOSIS — Z79899 Other long term (current) drug therapy: Secondary | ICD-10-CM | POA: Diagnosis not present

## 2024-02-06 DIAGNOSIS — Z7952 Long term (current) use of systemic steroids: Secondary | ICD-10-CM | POA: Diagnosis not present

## 2024-02-06 DIAGNOSIS — C3411 Malignant neoplasm of upper lobe, right bronchus or lung: Secondary | ICD-10-CM | POA: Diagnosis not present

## 2024-02-06 DIAGNOSIS — R058 Other specified cough: Secondary | ICD-10-CM | POA: Insufficient documentation

## 2024-02-06 DIAGNOSIS — Z923 Personal history of irradiation: Secondary | ICD-10-CM | POA: Diagnosis not present

## 2024-02-06 DIAGNOSIS — R5383 Other fatigue: Secondary | ICD-10-CM | POA: Insufficient documentation

## 2024-02-06 HISTORY — DX: Personal history of irradiation: Z92.3

## 2024-02-06 MED ORDER — METHYLPREDNISOLONE 4 MG PO TBPK
ORAL_TABLET | ORAL | 0 refills | Status: DC
Start: 2024-02-06 — End: 2024-05-11

## 2024-02-06 NOTE — Progress Notes (Signed)
 Christian Howard is here today for follow up post radiation to the lung.  Lung Side: Right, patient completed treatment on 01/13/24.  Does the patient complain of any of the following: Pain:No Shortness of breath w/wo exertion: Yes  Cough:  Yes, productive Hemoptysis: No Pain with swallowing: No Swallowing/choking concerns: No Appetite: Good Energy Level: Good Post radiation skin Changes: No    Additional comments if applicable: Patient reports nausea and dizziness with coughing .  BP (P) 128/77 (BP Location: Left Arm, Patient Position: Sitting)   Pulse (P) 86   Temp (!) (P) 96.2 F (35.7 C) (Temporal)   Resp (P) 18   Ht (P) 5' 8 (1.727 m)   Wt (P) 200 lb 6 oz (90.9 kg)   SpO2 98%   BMI (P) 30.47 kg/m

## 2024-03-19 ENCOUNTER — Encounter: Payer: Self-pay | Admitting: Internal Medicine

## 2024-03-19 ENCOUNTER — Ambulatory Visit (INDEPENDENT_AMBULATORY_CARE_PROVIDER_SITE_OTHER): Admitting: Internal Medicine

## 2024-03-19 ENCOUNTER — Ambulatory Visit (HOSPITAL_BASED_OUTPATIENT_CLINIC_OR_DEPARTMENT_OTHER)
Admission: RE | Admit: 2024-03-19 | Discharge: 2024-03-19 | Disposition: A | Discharge status: Home / Self Care | Source: Ambulatory Visit | Attending: Internal Medicine | Admitting: Internal Medicine

## 2024-03-19 ENCOUNTER — Other Ambulatory Visit: Payer: Self-pay

## 2024-03-19 VITALS — BP 130/80 | HR 82 | Temp 98.5°F | Wt 204.0 lb

## 2024-03-19 DIAGNOSIS — C3411 Malignant neoplasm of upper lobe, right bronchus or lung: Secondary | ICD-10-CM | POA: Insufficient documentation

## 2024-03-19 DIAGNOSIS — Z720 Tobacco use: Secondary | ICD-10-CM

## 2024-03-19 DIAGNOSIS — K279 Peptic ulcer, site unspecified, unspecified as acute or chronic, without hemorrhage or perforation: Secondary | ICD-10-CM

## 2024-03-19 DIAGNOSIS — R052 Subacute cough: Secondary | ICD-10-CM | POA: Diagnosis not present

## 2024-03-19 DIAGNOSIS — R059 Cough, unspecified: Secondary | ICD-10-CM | POA: Diagnosis not present

## 2024-03-19 DIAGNOSIS — E782 Mixed hyperlipidemia: Secondary | ICD-10-CM

## 2024-03-19 DIAGNOSIS — I10 Essential (primary) hypertension: Secondary | ICD-10-CM

## 2024-03-19 DIAGNOSIS — N529 Male erectile dysfunction, unspecified: Secondary | ICD-10-CM

## 2024-03-19 MED ORDER — ATORVASTATIN CALCIUM 20 MG PO TABS: 20.0000 mg | ORAL_TABLET | Freq: Every day | ORAL | 1 refills | Status: AC

## 2024-03-19 MED ORDER — AMLODIPINE BESYLATE 5 MG PO TABS: 5.0000 mg | ORAL_TABLET | Freq: Every day | ORAL | 1 refills | Status: AC

## 2024-03-19 MED ORDER — PANTOPRAZOLE SODIUM 40 MG PO TBEC: 40.0000 mg | DELAYED_RELEASE_TABLET | Freq: Every day | ORAL | 1 refills | Status: AC

## 2024-03-19 MED ORDER — TADALAFIL 20 MG PO TABS: 20.0000 mg | ORAL_TABLET | Freq: Every day | ORAL | 2 refills | Status: AC | PRN

## 2024-03-19 MED ORDER — VALSARTAN-HYDROCHLOROTHIAZIDE 160-25 MG PO TABS: 1.0000 | ORAL_TABLET | Freq: Every day | ORAL | 1 refills | Status: AC

## 2024-03-19 MED ORDER — BUPROPION HCL ER (XL) 150 MG PO TB24: 150.0000 mg | ORAL_TABLET | Freq: Every day | ORAL | 1 refills | Status: AC

## 2024-03-19 NOTE — Progress Notes (Signed)
 Established Patient Office Visit     CC/Reason for Visit: Cough  HPI: Christian Howard is a 64 y.o. male who is coming in today for the above mentioned reasons. Past Medical History is significant for: Adenocarcinoma of the right upper lung.  He was treated with radiation for the last few months he has been having cough that he feels is worsening.  Not productive.  No fever, no chills.  No sick contacts or recent travel.   Past Medical/Surgical History: Past Medical History:  Diagnosis Date   Blood transfusion without reported diagnosis 2001   states he had to have transfusion d/t gastric ulcer   Gastric ulcer with hemorrhage    GERD (gastroesophageal reflux disease)    History of radiation therapy    RIght lung- 12/30/23-01/13/24- Dr. Lynwood Nasuti   Hyperlipidemia    Hypertension     Past Surgical History:  Procedure Laterality Date   BRONCHIAL BIOPSY  09/30/2023   Procedure: BRONCHOSCOPY, WITH BIOPSY;  Surgeon: Shelah Lamar RAMAN, MD;  Location[facility] ENDOSCOPY;  Service: Pulmonary;;   BRONCHIAL NEEDLE ASPIRATION BIOPSY  09/30/2023   Procedure: BRONCHOSCOPY, WITH NEEDLE ASPIRATION BIOPSY;  Surgeon: Shelah Lamar RAMAN, MD;  Location: [facility] ENDOSCOPY;  Service: Pulmonary;;   ENDOBRONCHIAL ULTRASOUND Right 09/30/2023   Procedure: ENDOBRONCHIAL ULTRASOUND (EBUS);  Surgeon: Shelah Lamar RAMAN, MD;  Location[facility] ENDOSCOPY;  Service: Pulmonary;  Laterality: Right;   FINE NEEDLE ASPIRATION  09/30/2023   Procedure: FINE NEEDLE ASPIRATION;  Surgeon: Shelah Lamar RAMAN, MD;  Location: [facility] ENDOSCOPY;  Service: Pulmonary;;   WRIST SURGERY Right 1989    Social History:  reports that he has been smoking cigarettes. He has a 43 pack-year smoking history. He uses smokeless tobacco. He reports current alcohol use. He reports that he does not use drugs.  Allergies: No Known Allergies  Family History:  Family History  Problem Relation Age of Onset   Cancer Sister    Cancer Brother    Cancer Brother     Colon polyps Neg Hx    Colon cancer Neg Hx    Esophageal cancer Neg Hx    Stomach cancer Neg Hx    Rectal cancer Neg Hx      Current Outpatient Medications:    amLODipine  (NORVASC ) 5 MG tablet, Take 1 tablet (5 mg total) by mouth daily., Disp: 90 tablet, Rfl: 1   atorvastatin  (LIPITOR) 20 MG tablet, Take 1 tablet (20 mg total) by mouth daily., Disp: 90 tablet, Rfl: 1   buPROPion  (WELLBUTRIN  XL) 150 MG 24 hr tablet, Take 1 tablet (150 mg total) by mouth daily., Disp: 90 tablet, Rfl: 1   methylPREDNISolone  (MEDROL  DOSEPAK) 4 MG TBPK tablet, Day 1: 8 mg PO before breakfast, 4 mg after lunch and after dinner, and 8 mg at bedtime Day 2: 4 mg PO before breakfast, after lunch, and after dinner and 8 mg at bedtime Day 3: 4 mg PO before breakfast, after lunch, after dinner, and at bedtime Day 4: 4 mg PO before breakfast, after lunch, and at bedtime Day 5: 4 mg PO before breakfast and at bedtime Day 6: 4 mg PO before breakfast, Disp: 21 tablet, Rfl: 0   pantoprazole  (PROTONIX ) 40 MG tablet, Take 1 tablet (40 mg total) by mouth daily., Disp: 90 tablet, Rfl: 1   tadalafil  (CIALIS ) 20 MG tablet, Take 1 tablet (20 mg total) by mouth daily as needed for erectile dysfunction., Disp: 10 tablet, Rfl: 2   valsartan -hydrochlorothiazide  (DIOVAN -HCT) 160-25 MG tablet, Take  1 tablet by mouth daily., Disp: 90 tablet, Rfl: 1  Review of Systems:  Negative unless indicated in HPI.   Physical Exam: Vitals:   03/19/24 1321  BP: 130/80  Pulse: 82  Temp: 98.5 F (36.9 C)  TempSrc: Oral  SpO2: 98%  Weight: 204 lb (92.5 kg)    Body mass index is 31.02 kg/m (pended).   Physical Exam Vitals reviewed.  Constitutional:      Appearance: Normal appearance.  HENT:     Head: Normocephalic and atraumatic.  Eyes:     Conjunctiva/sclera: Conjunctivae normal.     Pupils: Pupils are equal, round, and reactive to light.  Cardiovascular:     Rate and Rhythm: Normal rate and regular rhythm.  Pulmonary:     Effort:  Pulmonary effort is normal.     Breath sounds: Examination of the right-upper field reveals rhonchi. Rhonchi present.  Skin:    General: Skin is warm and dry.  Neurological:     General: No focal deficit present.     Mental Status: He is alert and oriented to person, place, and time.  Psychiatric:        Mood and Affect: Mood normal.        Behavior: Behavior normal.        Thought Content: Thought content normal.        Judgment: Judgment normal.      Impression and Plan:  Primary adenocarcinoma of upper lobe of right lung (HCC) -     DG Chest 2 View; Future  Primary hypertension -     amLODIPine  Besylate; Take 1 tablet (5 mg total) by mouth daily.  Dispense: 90 tablet; Refill: 1  Mixed hyperlipidemia -     Atorvastatin  Calcium ; Take 1 tablet (20 mg total) by mouth daily.  Dispense: 90 tablet; Refill: 1  Tobacco abuse -     buPROPion  HCl ER (XL); Take 1 tablet (150 mg total) by mouth daily.  Dispense: 90 tablet; Refill: 1  PUD (peptic ulcer disease) -     Pantoprazole  Sodium; Take 1 tablet (40 mg total) by mouth daily.  Dispense: 90 tablet; Refill: 1  Erectile dysfunction, unspecified erectile dysfunction type -     Tadalafil ; Take 1 tablet (20 mg total) by mouth daily as needed for erectile dysfunction.  Dispense: 10 tablet; Refill: 2  Essential hypertension -     Valsartan -hydroCHLOROthiazide ; Take 1 tablet by mouth daily.  Dispense: 90 tablet; Refill: 1  Subacute cough -     DG Chest 2 View; Future   - I think this is likely effects of radiation but will do a chest x-ray to rule out any signs of infection. - Medications have been refilled.  Time spent:30 minutes reviewing chart, interviewing and examining patient and formulating plan of care.     Tully Theophilus Andrews, MD [facility]

## 2024-03-24 ENCOUNTER — Ambulatory Visit: Payer: Self-pay | Admitting: Internal Medicine

## 2024-05-01 ENCOUNTER — Telehealth: Payer: Self-pay | Admitting: *Deleted

## 2024-05-01 NOTE — Telephone Encounter (Signed)
 CALLED PATIENT TO INFORM OF CT FOR 05-08-24- ARRIVAL TIME- 1 PM @ [facility], NO RESTRICTIONS TO SCAN, PATIENT TO RECEIVE RESULTS FROM DR. KINARD ON 05-21-24 @ 11:15 AM, SPOKE WITH PATIENT AND HE IS AWARE OF THESE APPTS. AND THE INSTRUCTIONS

## 2024-05-08 ENCOUNTER — Ambulatory Visit (HOSPITAL_COMMUNITY)
Admission: RE | Admit: 2024-05-08 | Discharge: 2024-05-08 | Disposition: A | Discharge status: Home / Self Care | Source: Ambulatory Visit | Attending: Radiology | Admitting: Radiology

## 2024-05-08 DIAGNOSIS — C3411 Malignant neoplasm of upper lobe, right bronchus or lung: Secondary | ICD-10-CM | POA: Diagnosis not present

## 2024-05-11 ENCOUNTER — Encounter (HOSPITAL_BASED_OUTPATIENT_CLINIC_OR_DEPARTMENT_OTHER): Payer: Self-pay

## 2024-05-11 ENCOUNTER — Emergency Department (HOSPITAL_BASED_OUTPATIENT_CLINIC_OR_DEPARTMENT_OTHER): Admitting: Radiology

## 2024-05-11 ENCOUNTER — Emergency Department (HOSPITAL_BASED_OUTPATIENT_CLINIC_OR_DEPARTMENT_OTHER)
Admission: EM | Admit: 2024-05-11 | Discharge: 2024-05-11 | Disposition: A | Discharge status: Home / Self Care | Attending: Emergency Medicine | Admitting: Emergency Medicine

## 2024-05-11 ENCOUNTER — Other Ambulatory Visit: Payer: Self-pay

## 2024-05-11 DIAGNOSIS — F17200 Nicotine dependence, unspecified, uncomplicated: Secondary | ICD-10-CM | POA: Insufficient documentation

## 2024-05-11 DIAGNOSIS — R0989 Other specified symptoms and signs involving the circulatory and respiratory systems: Secondary | ICD-10-CM | POA: Diagnosis not present

## 2024-05-11 DIAGNOSIS — R911 Solitary pulmonary nodule: Secondary | ICD-10-CM | POA: Diagnosis not present

## 2024-05-11 DIAGNOSIS — R059 Cough, unspecified: Secondary | ICD-10-CM | POA: Diagnosis not present

## 2024-05-11 DIAGNOSIS — R051 Acute cough: Secondary | ICD-10-CM | POA: Diagnosis not present

## 2024-05-11 DIAGNOSIS — Z85118 Personal history of other malignant neoplasm of bronchus and lung: Secondary | ICD-10-CM | POA: Insufficient documentation

## 2024-05-11 LAB — RESP PANEL BY RT-PCR (RSV, FLU A&B, COVID)  RVPGX2
Influenza A by PCR: NEGATIVE
Influenza B by PCR: NEGATIVE
Resp Syncytial Virus by PCR: NEGATIVE
SARS Coronavirus 2 by RT PCR: NEGATIVE

## 2024-05-11 MED ORDER — PREDNISONE 50 MG PO TABS: 50.0000 mg | ORAL_TABLET | Freq: Every day | ORAL | 0 refills | Status: DC

## 2024-05-11 MED ORDER — BENZONATATE 100 MG PO CAPS: 100.0000 mg | ORAL_CAPSULE | Freq: Three times a day (TID) | ORAL | 0 refills | Status: AC

## 2024-05-11 MED ORDER — ALBUTEROL SULFATE HFA 108 (90 BASE) MCG/ACT IN AERS
1.0000 | INHALATION_SPRAY | Freq: Four times a day (QID) | RESPIRATORY_TRACT | Status: DC | PRN
  Filled 2024-05-11: qty 6.7

## 2024-05-11 NOTE — ED Provider Notes (Signed)
 [place] EMERGENCY DEPARTMENT AT [facility] Provider Note   CSN: [id number] Arrival date & time: 05/11/24  1225     Patient presents with: URI   Christian Howard is a 63 y.o. male.   The history is provided by the patient.  Cough Cough characteristics:  Non-productive Severity:  Moderate Onset quality:  Gradual Duration:  8 weeks (months) Timing:  Constant Context comment:  Sx started with his radiation tx for lung cancer Ineffective treatments:  None tried Associated symptoms: chills and sore throat   Associated symptoms: no rash, no rhinorrhea and no shortness of breath    Pt is not due to see his cancer doctors until next month.  He is trying to quit smoking.     Prior to Admission medications   Medication Sig Start Date End Date Taking? Authorizing Provider  benzonatate  (TESSALON ) 100 MG capsule Take 1 capsule (100 mg total) by mouth every 8 (eight) hours. 05/11/24  Yes Randol Simmonds, MD  predniSONE  (DELTASONE ) 50 MG tablet Take 1 tablet (50 mg total) by mouth daily. 05/11/24  Yes Randol Simmonds, MD  amLODipine  (NORVASC ) 5 MG tablet Take 1 tablet (5 mg total) by mouth daily. 03/19/24   Theophilus Andrews, Tully GRADE, MD  atorvastatin  (LIPITOR) 20 MG tablet Take 1 tablet (20 mg total) by mouth daily. 03/19/24   Theophilus Andrews, Tully GRADE, MD  buPROPion  (WELLBUTRIN  XL) 150 MG 24 hr tablet Take 1 tablet (150 mg total) by mouth daily. 03/19/24   Theophilus Andrews, Tully GRADE, MD  pantoprazole  (PROTONIX ) 40 MG tablet Take 1 tablet (40 mg total) by mouth daily. 03/19/24   Theophilus Andrews, Tully GRADE, MD  tadalafil  (CIALIS ) 20 MG tablet Take 1 tablet (20 mg total) by mouth daily as needed for erectile dysfunction. 03/19/24   Theophilus Andrews, Tully GRADE, MD  valsartan -hydrochlorothiazide  (DIOVAN -HCT) 160-25 MG tablet Take 1 tablet by mouth daily. 03/19/24   Theophilus Andrews, Tully GRADE, MD    Allergies: Patient has no known allergies.    Review of Systems  Constitutional:  Positive for  chills.  HENT:  Positive for sore throat. Negative for rhinorrhea.   Respiratory:  Positive for cough. Negative for shortness of breath.   Skin:  Negative for rash.    Updated Vital Signs BP 136/63 (BP Location: Right Arm)   Pulse (!) 52   Temp 98.6 F (37 C)   Resp 16   SpO2 96%   Physical Exam Vitals and nursing note reviewed.  Constitutional:      General: He is not in acute distress.    Appearance: He is well-developed.  HENT:     Head: Normocephalic and atraumatic.     Right Ear: External ear normal.     Left Ear: External ear normal.     Mouth/Throat:     Mouth: Mucous membranes are moist.     Pharynx: No oropharyngeal exudate or posterior oropharyngeal erythema.  Eyes:     General: No scleral icterus.       Right eye: No discharge.        Left eye: No discharge.     Conjunctiva/sclera: Conjunctivae normal.  Neck:     Trachea: No tracheal deviation.  Cardiovascular:     Rate and Rhythm: Normal rate.  Pulmonary:     Effort: Pulmonary effort is normal. No respiratory distress.     Breath sounds: No stridor.  Abdominal:     General: There is no distension.  Musculoskeletal:  General: No swelling or deformity.     Cervical back: Neck supple.  Skin:    General: Skin is warm and dry.     Findings: No rash.  Neurological:     Mental Status: He is alert. Mental status is at baseline.     Cranial Nerves: No dysarthria or facial asymmetry.     Motor: No seizure activity.     (all labs ordered are listed, but only abnormal results are displayed) Labs Reviewed  RESP PANEL BY RT-PCR (RSV, FLU A&B, COVID)  [id number]    EKG: None  Radiology: DG Chest 2 View Result Date: 05/11/2024 CLINICAL DATA:  Cough and congestion EXAM: CHEST - 2 VIEW COMPARISON:  Chest radiograph dated 03/19/2024 FINDINGS: Normal lung volumes. Known pulmonary nodule projecting over the right lower lobe. No focal consolidations. No pleural effusion or pneumothorax. The heart size and  mediastinal contours are within normal limits. No acute osseous abnormality. IMPRESSION: 1. No acute cardiopulmonary process. 2. Known pulmonary nodule projecting over the right lower lobe. Electronically Signed   By: Limin  Xu M.D.   On: 05/11/2024 13:41     Procedures   Medications Ordered in the ED  albuterol  (VENTOLIN  HFA) 108 (90 Base) MCG/ACT inhaler 1-2 puff (has no administration in time range)    Clinical Course as of 05/11/24 1517  Mon May 11, 2024  1500 CXR negative other than known nodule [JK]  1500 Resp panel by RT-PCR (RSV, Flu A&B, Covid) Anterior Nasal Swab nl [JK]    Clinical Course User Index [JK] Randol Simmonds, MD                                 Medical Decision Making Amount and/or Complexity of Data Reviewed Labs:  Decision-making details documented in ED Course. Radiology: ordered.  Risk Prescription drug management.   Pt with persistent cough since his radiation tx.  No dyspnea.  Doubt PE.  Lungs clear on exam. No pna on cxr.  Cough may be related to his radiation tx and known lung ca but he may also have a component of bronchitis with is continued smoking.  Will try steroids, alb, tessalon  for symptomatic relief.   Follow up with his pcp, oncologist     Final diagnoses:  Acute cough    ED Discharge Orders          Ordered    predniSONE  (DELTASONE ) 50 MG tablet  Daily        05/11/24 1515    benzonatate  (TESSALON ) 100 MG capsule  Every 8 hours        05/11/24 1515               Randol Simmonds, MD 05/11/24 1521

## 2024-05-11 NOTE — ED Triage Notes (Signed)
 Pt c/o cough, congestion x awhile- like 2mos. States hx lung cancer, advises done w tx & they told me I'd be coughing but this is worse.

## 2024-05-11 NOTE — Discharge Instructions (Signed)
 The xray did not show any pneumonia.  The covid flu tests was negative. Take the medications to help with your persistent cough. Follow up with your lung and cancer doctors to be rechecked in a week or so.  Continue to try and quit smoking

## 2024-05-13 ENCOUNTER — Telehealth: Payer: Self-pay

## 2024-05-13 NOTE — Telephone Encounter (Signed)
 Patient called in to report having  a acute cough that is causing vision loss. Patient was seen in the ED on 05/11/24 and was prescribed prednisone  and tessalon  pearls. Patient reports minimal relief with new medications. Denies any memory or balance issues.  Patient completed treatment to right lung on 01/13/24.

## 2024-05-18 ENCOUNTER — Ambulatory Visit: Payer: Self-pay | Admitting: Radiation Oncology

## 2024-05-20 NOTE — Progress Notes (Signed)
 Radiation Oncology         [phone] ________________________________  Name: Christian Howard MRN: [id number]  Date: 05/21/2024  DOB: 1960/07/26  Follow-Up Visit Note  CC: Theophilus Andrews, Tully GRADE, MD  Theophilus Andrews, Estel*  No diagnosis found.  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 He was last seen in office on 02/06/24 for a follow up visit. Patient continued to follow up with their specialists to manage their chronic conditions.   In the interval since he was last seen, he was seen in the ED on 05/11/24 for an acute cough. Workup with no significant findings and patient continues to be a smoker. He was treated with steroids alb, tessalon  for symptomatic relief and discharged.   Most recent CT chest performed on 05/08/24 showed a stable right upper lobe peribronchial nodule, decreased to 10 mm from 12 mm, consistent with treatment response with no new pulmonary nodules or thoracic lymphadenopathy.       . No other significant oncologic interval history since the patient was last seen.                           Allergies:  has no known allergies.  Meds: Current Outpatient Medications  Medication Sig Dispense Refill   amLODipine  (NORVASC ) 5 MG tablet Take 1 tablet (5 mg total) by mouth daily. 90 tablet 1   atorvastatin  (LIPITOR) 20 MG tablet Take 1 tablet (20 mg total) by mouth daily. 90 tablet 1   benzonatate  (TESSALON ) 100 MG capsule Take 1 capsule (100 mg total) by mouth every 8 (eight) hours.  21 capsule 0   buPROPion  (WELLBUTRIN  XL) 150 MG 24 hr tablet Take 1 tablet (150 mg total) by mouth daily. 90 tablet 1   pantoprazole  (PROTONIX ) 40 MG tablet Take 1 tablet (40 mg total) by mouth daily. 90 tablet 1   predniSONE  (DELTASONE ) 50 MG tablet Take 1 tablet (50 mg total) by mouth daily. 5 tablet 0   tadalafil  (CIALIS ) 20 MG tablet Take 1 tablet (20 mg total) by mouth daily as needed for erectile dysfunction. 10 tablet 2   valsartan -hydrochlorothiazide  (DIOVAN -HCT) 160-25 MG tablet Take 1 tablet by mouth daily. 90 tablet 1   No current facility-administered medications for this visit.    Physical Findings: The patient is in no acute distress. Patient is alert and oriented.  vitals were not taken for this visit. .  No significant changes. Lungs are clear to auscultation bilaterally. Heart has regular rate and rhythm. No palpable cervical, supraclavicular, or axillary adenopathy. Abdomen soft, non-tender, normal bowel sounds.   Lab Findings: Lab Results  Component Value Date   WBC 7.5 04/11/2023   HGB 16.5 04/11/2023   HCT 50.5 04/11/2023   MCV 92.6 04/11/2023   PLT 270.0 04/11/2023    Radiographic Findings: CT CHEST WO CONTRAST Result Date: 05/13/2024 EXAM: CT CHEST WITHOUT CONTRAST 05/08/2024 01:55:00 PM TECHNIQUE: CT of the chest was performed without the administration of intravenous contrast. Multiplanar reformatted images  are provided for review. Automated exposure control, iterative reconstruction, and/or weight based adjustment of the mA/kV was utilized to reduce the radiation dose to as low as reasonably achievable. COMPARISON: CT scan 08/23/2023. CLINICAL HISTORY: Non-small cell lung cancer (NSCLC), monitor. Local history non suspicious lung cancer. * Tracking Code: BO * FINDINGS: MEDIASTINUM: Heart and pericardium are unremarkable. The central airways are clear. LYMPH NODES: No lymphadenopathy identified. LUNGS AND PLEURA: Right upper lobe peribronchial nodule measures 10  mm on image 80, series 8, compared to 12 mm on the prior study. No new pulmonary nodules. Partially calcified nodule in the right lower lobe is unchanged on image 82. No focal consolidation or pulmonary edema. No pleural effusion or pneumothorax. No acute or progressive lung cancer. SOFT TISSUES/BONES: No acute abnormality of the bones or soft tissues. UPPER ABDOMEN: Limited images of the upper abdomen demonstrates normal adrenal glands. Stable cannot decrease in size of liver lipomediastinal following radiation therapy. IMPRESSION: 1. Stable right upper lobe peribronchial nodule, decreased to 10 mm from 12 mm, consistent with treatment response. 2. No new pulmonary nodules or thoracic lymphadenopathy. Electronically signed by: Norleen Boxer MD 05/13/2024 10:45 AM EST RP Workstation: [id number]   DG Chest 2 View Result Date: 05/11/2024 CLINICAL DATA:  Cough and congestion EXAM: CHEST - 2 VIEW COMPARISON:  Chest radiograph dated 03/19/2024 FINDINGS: Normal lung volumes. Known pulmonary nodule projecting over the right lower lobe. No focal consolidations. No pleural effusion or pneumothorax. The heart size and mediastinal contours are within normal limits. No acute osseous abnormality. IMPRESSION: 1. No acute cardiopulmonary process. 2. Known pulmonary nodule projecting over the right lower lobe. Electronically Signed   By: Limin  Xu M.D.   On: 05/11/2024 13:41    Impression:  Stage IA2 (cT1b, N0, M0) adenocarcinoma, NSCLC, of the right upper lobe  The patient is recovering from the effects of radiation.  ***  Plan:  ***   *** minutes of total time was spent for this patient encounter, including preparation, face-to-face counseling with the patient and coordination of care, physical exam, and documentation of the encounter. ____________________________________  Lynwood CHARM Nasuti, PhD, MD  This document serves as a record of services personally performed by Lynwood Nasuti, MD. It was created on his behalf  by Reymundo Cartwright, a trained medical scribe. The creation of this record is based on the scribe's personal observations and the provider's statements to them. This document has been checked and approved by the attending provider.

## 2024-05-21 ENCOUNTER — Ambulatory Visit
Admission: RE | Admit: 2024-05-21 | Discharge: 2024-05-21 | Attending: Radiation Oncology | Admitting: Radiation Oncology

## 2024-05-21 ENCOUNTER — Encounter: Payer: Self-pay | Admitting: Radiation Oncology

## 2024-05-21 VITALS — BP 142/74 | HR 100 | Temp 97.5°F | Resp 18 | Ht 68.0 in | Wt 203.4 lb

## 2024-05-21 DIAGNOSIS — Z923 Personal history of irradiation: Secondary | ICD-10-CM | POA: Diagnosis not present

## 2024-05-21 DIAGNOSIS — C3411 Malignant neoplasm of upper lobe, right bronchus or lung: Secondary | ICD-10-CM

## 2024-05-21 DIAGNOSIS — F17210 Nicotine dependence, cigarettes, uncomplicated: Secondary | ICD-10-CM | POA: Diagnosis not present

## 2024-05-21 DIAGNOSIS — Z7952 Long term (current) use of systemic steroids: Secondary | ICD-10-CM | POA: Diagnosis not present

## 2024-05-21 DIAGNOSIS — Z79899 Other long term (current) drug therapy: Secondary | ICD-10-CM | POA: Diagnosis not present

## 2024-05-21 MED ORDER — DOXYCYCLINE HYCLATE 100 MG PO TABS: 100.0000 mg | ORAL_TABLET | Freq: Two times a day (BID) | ORAL | 0 refills | Status: AC

## 2024-05-21 MED ORDER — DEXTROMETHORPHAN HBR 15 MG/5ML PO SYRP: 10.0000 mL | ORAL_SOLUTION | Freq: Four times a day (QID) | ORAL | 1 refills | Status: DC | PRN

## 2024-05-21 MED ORDER — GUAIFENESIN-CODEINE 100-10 MG/5ML PO SOLN: 10.0000 mL | Freq: Three times a day (TID) | ORAL | 0 refills | Status: DC | PRN

## 2024-05-21 NOTE — Progress Notes (Signed)
 Christian Howard is here today for follow up post radiation to the lung.  Lung Side:  Right,patient completed treatment on 01/13/24  Does the patient complain of any of the following: Pain: Yes, reports severe throat, mouth and chest pain. Patient reports no relief tessalon  pearls. Patient was also given prednisone  which he has completed.  Shortness of breath w/wo exertion: Yes,  Cough: Yes, productive cough with thick green mucous.  Hemoptysis: No Pain with swallowing: Yes Swallowing/choking concerns: no Appetite: [employer] Level: Low Post radiation skin Changes: No    Additional comments if applicable:   BP (!) 142/74 (BP Location: Left Arm, Patient Position: Sitting)   Pulse 100   Temp (!) 97.5 F (36.4 C) (Temporal)   Resp 18   Ht 5' 8 (1.727 m)   Wt 203 lb 6 oz (92.3 kg)   SpO2 100%   BMI 30.92 kg/m

## 2024-05-22 ENCOUNTER — Emergency Department (HOSPITAL_BASED_OUTPATIENT_CLINIC_OR_DEPARTMENT_OTHER)

## 2024-05-22 ENCOUNTER — Other Ambulatory Visit: Payer: Self-pay

## 2024-05-22 ENCOUNTER — Encounter (HOSPITAL_BASED_OUTPATIENT_CLINIC_OR_DEPARTMENT_OTHER): Payer: Self-pay | Admitting: Emergency Medicine

## 2024-05-22 ENCOUNTER — Emergency Department (HOSPITAL_BASED_OUTPATIENT_CLINIC_OR_DEPARTMENT_OTHER)
Admission: EM | Admit: 2024-05-22 | Discharge: 2024-05-22 | Disposition: A | Discharge status: Home / Self Care | Attending: Emergency Medicine | Admitting: Emergency Medicine

## 2024-05-22 DIAGNOSIS — C3490 Malignant neoplasm of unspecified part of unspecified bronchus or lung: Secondary | ICD-10-CM | POA: Diagnosis not present

## 2024-05-22 DIAGNOSIS — R051 Acute cough: Secondary | ICD-10-CM

## 2024-05-22 DIAGNOSIS — R052 Subacute cough: Secondary | ICD-10-CM | POA: Insufficient documentation

## 2024-05-22 DIAGNOSIS — R911 Solitary pulmonary nodule: Secondary | ICD-10-CM | POA: Diagnosis not present

## 2024-05-22 DIAGNOSIS — R059 Cough, unspecified: Secondary | ICD-10-CM | POA: Diagnosis not present

## 2024-05-22 DIAGNOSIS — R0600 Dyspnea, unspecified: Secondary | ICD-10-CM

## 2024-05-22 DIAGNOSIS — R0602 Shortness of breath: Secondary | ICD-10-CM | POA: Diagnosis not present

## 2024-05-22 LAB — CBC WITH DIFFERENTIAL/PLATELET
Abs Immature Granulocytes: 0.03 K/uL (ref 0.00–0.07)
Basophils Absolute: 0 K/uL (ref 0.0–0.1)
Basophils Relative: 1 %
Eosinophils Absolute: 0.2 K/uL (ref 0.0–0.5)
Eosinophils Relative: 2 %
HCT: 42.3 % (ref 39.0–52.0)
Hemoglobin: 15 g/dL (ref 13.0–17.0)
Immature Granulocytes: 0 %
Lymphocytes Relative: 18 %
Lymphs Abs: 1.6 K/uL (ref 0.7–4.0)
MCH: 31.3 pg (ref 26.0–34.0)
MCHC: 35.5 g/dL (ref 30.0–36.0)
MCV: 88.1 fL (ref 80.0–100.0)
Monocytes Absolute: 0.7 K/uL (ref 0.1–1.0)
Monocytes Relative: 8 %
Neutro Abs: 6.3 K/uL (ref 1.7–7.7)
Neutrophils Relative %: 71 %
Platelets: 228 K/uL (ref 150–400)
RBC: 4.8 MIL/uL (ref 4.22–5.81)
RDW: 12.4 % (ref 11.5–15.5)
WBC: 8.8 K/uL (ref 4.0–10.5)
nRBC: 0 % (ref 0.0–0.2)

## 2024-05-22 LAB — BASIC METABOLIC PANEL WITH GFR
Anion gap: 12 (ref 5–15)
BUN: 11 mg/dL (ref 8–23)
CO2: 26 mmol/L (ref 22–32)
Calcium: 9.7 mg/dL (ref 8.9–10.3)
Chloride: 98 mmol/L (ref 98–111)
Creatinine, Ser: 1.16 mg/dL (ref 0.61–1.24)
GFR, Estimated: >60 mL/min (ref >60)
Glucose, Bld: 147 mg/dL — ABNORMAL HIGH (ref 70–99)
Potassium: 4 mmol/L (ref 3.5–5.1)
Sodium: 136 mmol/L (ref 135–145)

## 2024-05-22 LAB — RESP PANEL BY RT-PCR (RSV, FLU A&B, COVID)  RVPGX2
Influenza A by PCR: NEGATIVE
Influenza B by PCR: NEGATIVE
Resp Syncytial Virus by PCR: NEGATIVE
SARS Coronavirus 2 by RT PCR: NEGATIVE

## 2024-05-22 LAB — PRO BRAIN NATRIURETIC PEPTIDE: Pro Brain Natriuretic Peptide: <50 pg/mL (ref <300.0)

## 2024-05-22 MED ORDER — IOHEXOL 350 MG/ML SOLN
75.0000 mL | Freq: Once | INTRAVENOUS | Status: AC | PRN
  Administered 2024-05-22: 75 mL via INTRAVENOUS

## 2024-05-22 MED ORDER — ERYTHROMYCIN 5 MG/GM OP OINT: TOPICAL_OINTMENT | OPHTHALMIC | 0 refills | Status: AC

## 2024-05-22 NOTE — Discharge Instructions (Addendum)
 No evidence of blood clots. Results of your CT chest: IMPRESSION:  1. No evidence of pulmonary embolus.  2. No acute airspace disease.  3. Stable spiculated 7 x 11 mm right upper lobe pulmonary nodule,  compatible with known history of lung cancer.   We saw you in the ER for the chest pain/shortness of breath/cough. All of our cardiac workup is normal, including labs, EKG and chest X-RAY are normal.  There is no evidence of heart failure.  There is no evidence of heart failure.  Your blood pressure is normal here.  For the eye, apply the antibiotic ointment that is prescribed.  Also purchase artificial tears for tearing and irritation.

## 2024-05-22 NOTE — ED Triage Notes (Signed)
 High BP at work told to get checked out Denies c/o headache blurred vision, denies cp  Has cough and nasal congestion was placed on ABT through [facility] Concerned that eyes are red ( this is why they checked his BP- told him to get checked for CHF?)

## 2024-05-22 NOTE — ED Provider Notes (Signed)
 Patient care signed out to follow-up CT angiogram results for final disposition with likely plan for outpatient follow-up with primary doctor.  CT angiogram results independently reviewed no blood clot, no signs of any new or acute abnormalities.  Patient has known lung cancer.  Patient stable for discharge and outpatient follow-up.   Tonia Chew, MD 05/22/24 [phone]

## 2024-05-22 NOTE — ED Provider Notes (Signed)
 [place] EMERGENCY DEPARTMENT AT [facility] Provider Note   CSN: [id number] Arrival date & time: 05/22/24  1310     Patient presents with: Hypertension   Christian Howard is a 63 y.o. male.  {Add pertinent medical, surgical, social history, OB history to HPI:32947} HPI    63 y/o comes in with chief complaint of elevated blood pressure and concerns for heart failure.  Patient has known history of primary adenocarcinoma of the right lung for which she is getting radiation therapy.  He has had chronic cough.  He also has previous history of cocaine use.   Patient states that he has had a cough now for a long time.  More recently, he has had worsening of the cough, has had some congestion and tearing of his eye with crusting.  He also felt a little bit of shortness of breath.  He went to his work clinic and they told him that his BP was  105 and that he needed to go to the ER to rule out  CHF.  Patient states that his cough and shortness of breath therefore is worse at nighttime.  He has some chest discomfort with deep inspiration and cough. Patient denies any fevers, chills. Prior to Admission medications   Medication Sig Start Date End Date Taking? Authorizing Provider  erythromycin  ophthalmic ointment Place a 1/2 inch ribbon of ointment into the lower eyelid. 05/22/24  Yes Charlyn Sora, MD  amLODipine  (NORVASC ) 5 MG tablet Take 1 tablet (5 mg total) by mouth daily. 03/19/24   Theophilus Andrews, Tully GRADE, MD  atorvastatin  (LIPITOR) 20 MG tablet Take 1 tablet (20 mg total) by mouth daily. 03/19/24   Theophilus Andrews, Tully GRADE, MD  benzonatate  (TESSALON ) 100 MG capsule Take 1 capsule (100 mg total) by mouth every 8 (eight) hours. 05/11/24   Randol Simmonds, MD  buPROPion  (WELLBUTRIN  XL) 150 MG 24 hr tablet Take 1 tablet (150 mg total) by mouth daily. 03/19/24   Theophilus Andrews, Tully GRADE, MD  doxycycline  (VIBRA -TABS) 100 MG tablet Take 1 tablet (100 mg total) by mouth 2 (two) times  daily. 05/21/24   Wyatt Leeroy HERO, PA-C  guaiFENesin -codeine  100-10 MG/5ML syrup Take 10 mLs by mouth 3 (three) times daily as needed for cough. 05/21/24   Wyatt Leeroy HERO, PA-C  pantoprazole  (PROTONIX ) 40 MG tablet Take 1 tablet (40 mg total) by mouth daily. 03/19/24   Theophilus Andrews, Tully GRADE, MD  predniSONE  (DELTASONE ) 50 MG tablet Take 1 tablet (50 mg total) by mouth daily. Patient not taking: Reported on 05/21/2024 05/11/24   Randol Simmonds, MD  tadalafil  (CIALIS ) 20 MG tablet Take 1 tablet (20 mg total) by mouth daily as needed for erectile dysfunction. 03/19/24   Theophilus Andrews, Tully GRADE, MD  valsartan -hydrochlorothiazide  (DIOVAN -HCT) 160-25 MG tablet Take 1 tablet by mouth daily. 03/19/24   Theophilus Andrews, Tully GRADE, MD    Allergies: Patient has no known allergies.    Review of Systems  All other systems reviewed and are negative.   Updated Vital Signs BP (!) 144/84   Pulse 95   Temp 98.4 F (36.9 C) (Oral)   Resp 20   SpO2 95%   Physical Exam Vitals and nursing note reviewed.  Constitutional:      Appearance: He is well-developed.  HENT:     Head: Atraumatic.  Cardiovascular:     Rate and Rhythm: Normal rate.  Pulmonary:     Effort: Pulmonary effort is normal. No respiratory distress.  Breath sounds: No stridor. No wheezing, rhonchi or rales.  Musculoskeletal:     Cervical back: Neck supple.     Right lower leg: No edema.     Left lower leg: No edema.  Skin:    General: Skin is warm.  Neurological:     Mental Status: He is alert and oriented to person, place, and time.     (all labs ordered are listed, but only abnormal results are displayed) Labs Reviewed  BASIC METABOLIC PANEL WITH GFR - Abnormal; Notable for the following components:      Result Value   Glucose, Bld 147 (*)    All other components within normal limits  RESP PANEL BY RT-PCR (RSV, FLU A&B, COVID)  RVPGX2  CBC WITH DIFFERENTIAL/PLATELET  PRO BRAIN NATRIURETIC PEPTIDE     EKG: None  Radiology: No results found.  {Document cardiac monitor, telemetry assessment procedure when appropriate:32947} Procedures   Medications Ordered in the ED  iohexol  (OMNIPAQUE ) 350 MG/ML injection 75 mL (75 mLs Intravenous Contrast Given 05/22/24 1531)      {Click here for ABCD2, HEART and other calculators REFRESH Note before signing:1}                              Medical Decision Making Amount and/or Complexity of Data Reviewed Labs: ordered. Radiology: ordered.  Risk Prescription drug management.   This patient presents to the ED with chief complaint(s) of cough, pleuritic chest pain, elevated blood pressure and concerns for CHF with pertinent past medical history of right-sided lung cancer, cocaine use disorder, PUD.The complaint involves an extensive differential diagnosis and also carries with it a high risk of complications and morbidity.    The differential diagnosis considered for this patient includes  ACS syndrome Aortic dissection CHF exacerbation Valvular disorder Myocarditis Pericarditis Endocarditis Pericardial effusion / tamponade Pneumonia Pleural effusion / Pulmonary edema PE Pneumothorax Viral illness  The initial plan is to get basic labs, CT angio chest PE and BNP.    Additional history obtained: Records reviewed Primary Care Documents and oncology note.  Independent labs interpretation:  The following labs were independently interpreted: CBC, antibiotic profile which are normal.  BNP is less than 50.  Independent visualization and interpretation of imaging: - I independently visualized the following imaging with scope of interpretation limited to determining acute life threatening conditions related to emergency care: ***, which revealed ***  Treatment and Reassessment: ***  Consultation: - Consulted or discussed management/test interpretation with external professional: ***  Consideration for admission or further  workup:  Social Determinants of health:    Final diagnoses:  Acute cough  Malignant neoplasm of lung, unspecified laterality, unspecified part of lung (HCC)  Dyspnea, unspecified type    ED Discharge Orders          Ordered    erythromycin  ophthalmic ointment        05/22/24 1553

## 2024-06-29 ENCOUNTER — Telehealth: Payer: Self-pay

## 2024-06-29 ENCOUNTER — Other Ambulatory Visit: Payer: Self-pay | Admitting: Radiation Oncology

## 2024-06-29 MED ORDER — METHYLPREDNISOLONE 4 MG PO TBPK: ORAL_TABLET | ORAL | 0 refills | Status: AC

## 2024-06-29 MED ORDER — HYDROCOD POLI-CHLORPHE POLI ER 10-8 MG/5ML PO SUER: 5.0000 mL | Freq: Every evening | ORAL | 0 refills | Status: DC | PRN

## 2024-06-29 NOTE — Telephone Encounter (Signed)
 Patient called in to report that he continues to have a productive cough that is causing some chest burning and discomfort. Patient reports he has completed doxycycline  and guaifenesin -codeine  with no relief. Patient is asking for recommendations. Please advise

## 2024-07-21 ENCOUNTER — Other Ambulatory Visit: Payer: Self-pay | Admitting: Radiology

## 2024-07-21 ENCOUNTER — Telehealth: Payer: Self-pay | Admitting: *Deleted

## 2024-07-21 MED ORDER — HYDROCOD POLI-CHLORPHE POLI ER 10-8 MG/5ML PO SUER: 5.0000 mL | Freq: Every evening | ORAL | 0 refills | Status: AC | PRN

## 2024-07-22 ENCOUNTER — Ambulatory Visit (HOSPITAL_COMMUNITY)

## 2024-07-23 ENCOUNTER — Ambulatory Visit: Admitting: Radiation Oncology

## 2024-07-24 ENCOUNTER — Ambulatory Visit (HOSPITAL_COMMUNITY): Admission: RE | Admit: 2024-07-24

## 2024-07-24 DIAGNOSIS — C3411 Malignant neoplasm of upper lobe, right bronchus or lung: Secondary | ICD-10-CM

## 2024-07-27 ENCOUNTER — Ambulatory Visit: Admitting: Radiation Oncology

## 2024-08-06 ENCOUNTER — Encounter: Admitting: Internal Medicine

## 2024-11-26 ENCOUNTER — Ambulatory Visit: Admitting: Radiation Oncology
